# Patient Record
Sex: Female | Born: 1970 | Race: Black or African American | Hispanic: No | Marital: Married | Smoking: Never smoker
Health system: Southern US, Community
[De-identification: ages and names within clinical notes are randomized; demographics above are authoritative.]

## PROBLEM LIST (undated history)

## (undated) DIAGNOSIS — M797 Fibromyalgia: Secondary | ICD-10-CM

---

## 2004-01-27 ENCOUNTER — Ambulatory Visit (HOSPITAL_COMMUNITY): Admission: RE | Admit: 2004-01-27 | Discharge: 2004-01-27 | Payer: Self-pay | Admitting: *Deleted

## 2004-05-21 ENCOUNTER — Inpatient Hospital Stay (HOSPITAL_COMMUNITY): Admission: AD | Admit: 2004-05-21 | Discharge: 2004-05-21 | Payer: Self-pay | Admitting: *Deleted

## 2004-05-24 ENCOUNTER — Inpatient Hospital Stay (HOSPITAL_COMMUNITY): Admission: AD | Admit: 2004-05-24 | Discharge: 2004-05-24 | Payer: Self-pay | Admitting: Obstetrics and Gynecology

## 2004-05-31 ENCOUNTER — Inpatient Hospital Stay (HOSPITAL_COMMUNITY): Admission: AD | Admit: 2004-05-31 | Discharge: 2004-05-31 | Payer: Self-pay | Admitting: Obstetrics & Gynecology

## 2004-06-01 ENCOUNTER — Inpatient Hospital Stay (HOSPITAL_COMMUNITY): Admission: AD | Admit: 2004-06-01 | Discharge: 2004-06-03 | Payer: Self-pay | Admitting: Obstetrics and Gynecology

## 2006-02-10 ENCOUNTER — Ambulatory Visit (HOSPITAL_COMMUNITY): Admission: RE | Admit: 2006-02-10 | Discharge: 2006-02-10 | Payer: Self-pay | Admitting: Obstetrics and Gynecology

## 2011-06-22 ENCOUNTER — Encounter (HOSPITAL_COMMUNITY): Payer: Self-pay | Admitting: Emergency Medicine

## 2011-06-22 ENCOUNTER — Emergency Department (HOSPITAL_COMMUNITY)
Admission: EM | Admit: 2011-06-22 | Discharge: 2011-06-22 | Disposition: A | Discharge status: Home / Self Care | Payer: Medicaid Other | Attending: Emergency Medicine | Admitting: Emergency Medicine

## 2011-06-22 DIAGNOSIS — B9789 Other viral agents as the cause of diseases classified elsewhere: Secondary | ICD-10-CM

## 2011-06-22 DIAGNOSIS — J029 Acute pharyngitis, unspecified: Secondary | ICD-10-CM | POA: Insufficient documentation

## 2011-06-22 MED ORDER — DEXAMETHASONE 6 MG PO TABS
6.0000 mg | ORAL_TABLET | Freq: Once | ORAL | Status: AC
  Administered 2011-06-22: 6 mg via ORAL
  Filled 2011-06-22: qty 1

## 2011-06-22 MED ORDER — IBUPROFEN 200 MG PO TABS
600.0000 mg | ORAL_TABLET | Freq: Once | ORAL | Status: AC
  Administered 2011-06-22: 600 mg via ORAL
  Filled 2011-06-22: qty 3

## 2011-06-22 NOTE — Discharge Instructions (Signed)
Sore Throat Take ibuprofen and use saltwater gargles.  Follow up with your providers as dicussed in the ED today and as written above.  See your doctor immediately--or return to the ED--with any new or troubling symptoms including fevers, weakness, new chest pain, shortness or breath, numbness, or any other concerning symptom.  In the  Sore throats may be caused by bacteria and viruses. They may also be caused by:  Smoking.   Pollution.   Allergies.  If a sore throat is due to strep infection (a bacterial infection), you may need:  A throat swab.   A culture test to verify the strep infection.  You will need one of these:  An antibiotic shot.   Oral medicine for a full 10 days.  Strep infection is very contagious. A doctor should check any close contacts who have a sore throat or fever. A sore throat caused by a virus infection will usually last only 3-4 days. Antibiotics will not treat a viral sore throat.  Infectious mononucleosis (a viral disease), however, can cause a sore throat that lasts for up to 3 weeks. Mononucleosis can be diagnosed with blood tests. You must have been sick for at least 1 week in order for the test to give accurate results. HOME CARE INSTRUCTIONS   To treat a sore throat, take mild pain medicine.   Increase your fluids.   Eat a soft diet.   Do not smoke.   Gargling with warm water or salt water (1 tsp. salt in 8 oz. water) can be helpful.   Try throat sprays or lozenges or sucking on hard candy to ease the symptoms.  Call your doctor if your sore throat lasts longer than 1 week.  SEEK IMMEDIATE MEDICAL CARE IF:  You have difficulty breathing.   You have increased swelling in the throat.   You have pain so severe that you are unable to swallow fluids or your saliva.   You have a severe headache, a high fever, vomiting, or a red rash.  Document Released: 02/10/2004 Document Revised: 12/22/2010 Document Reviewed: 12/20/2006 [facility] Patient  Information 2012 [place], [place]. I and oriented and in [employer]  This solution will help make your mouth and throat feel better.    HOME CARE INSTRUCTIONS   Mix 1 teaspoon of salt in 8 ounces of warm water.   Gargle with this solution as much or often as you need or as directed. Swish and gargle gently if you have any sores or wounds in your mouth.   Do not swallow this mixture.  Document Released: 10/07/2003 Document Revised: 12/22/2010 Document Reviewed: 02/28/2008 [facility] Patient Information 2012 [employer] All of and equal in, [place]. In the and

## 2011-06-22 NOTE — ED Provider Notes (Signed)
History     CSN: [id number]  Arrival date & time 06/22/11  2142   First MD Initiated Contact with Patient 06/22/11 2207      Chief Complaint  Patient presents with  . Sore Throat    (Consider location/radiation/quality/duration/timing/severity/associated sxs/prior treatment) HPI  Patient is a 41 year old female with history of fibromyalgia presents today with a four-day history of sore throat and some congestion. Sore throat started first.  Sore throat is worse toward evening. She also does endorse some mild nasal congestion and drainage. She denies any fevers. She endorses a mild intermittent cough with no production. She denies any changes in urine output she denies any chest pain nausea, vomiting, or diarrhea. Denies headache. Denies any swelling of the neck or face. She calls her illness moderate. Her pain when swallowing is 6/10. On arrival temperature 98.54F, pulse 73, respirations 18, blood pressure is 120/89, SpO2 98% on room air    History reviewed. No pertinent past medical history.  History reviewed. No pertinent past surgical history.  No family history on file.  History  Substance Use Topics  . Smoking status: Never Smoker   . Smokeless tobacco: Not on file  . Alcohol Use: No    OB History    Grav Para Term Preterm Abortions TAB SAB Ect Mult Living                  Review of Systems Constitutional: Negative for fever and chills.  HENT: Negative for ear pain, POS sore throat and POS ondynophagia.  POS congestion.  Eyes: Negative for pain and visual disturbance.  Respiratory: Negative for cough and shortness of breath.   Cardiovascular: Negative for chest pain and leg swelling.  Gastrointestinal: Negative for nausea, vomiting, abdominal pain and diarrhea.  Genitourinary: Negative for dysuria, urgency and frequency.  Musculoskeletal: Negative for back pain and joint swelling.  Skin: Negative for rash and wound.  Neurological: Negative for dizziness, syncope,  speech difficulty, weakness and numbness.    Allergies  Latex  Home Medications   Current Outpatient Rx  Name Route Sig Dispense Refill  . IBUPROFEN 200 MG PO TABS Oral Take 400 mg by mouth every 6 (six) hours as needed. For pain    . OVER THE COUNTER MEDICATION Oral Take 2 tablets by mouth daily as needed. For congestion      BP 151/84  Pulse 65  Temp(Src) 98.5 F (36.9 C) (Oral)  Resp 18  SpO2 99%  LMP 06/21/2011  Physical Exam Consitutional: Pt in no acute distress.   Head: Normocephalic and atraumatic.  Eyes: Extraocular motion intact, no scleral icterus Neck: Supple without meningismus, mass, or overt JVD. No anterior LA.  OPA: mild erythema, cobblestones posterior pharynx.  No exudate or meaningful swelling of pillars.  Symmetric uvula.   Respiratory: Effort normal and breath sounds normal. No respiratory distress. CV: Heart regular rate and rhythm, no obvious murmurs.  Pulses +2 and symmetric Abdomen: Soft, non-tender, non-distended MSK: Extremities are atraumatic without deformity, ROM intact Skin: Warm, dry, intact Neuro: Alert and oriented, no motor deficit noted.  Psychiatric: Mood and affect are normal    ED Course  Procedures (including critical care time)   Labs Reviewed  RAPID STREP SCREEN   No results found.   1. Viral sore throat       MDM  Patient with a mild presentation and poor story for strep throat. However patient strongly desires sore throat swab. We'll complete rapid strep and throat culture. Patient looks very  well on exam with good saturations. Afebrile. Her exam is consistent with viral infection with some mild erythema of the posterior pharynx as well as boggy turbinates. Will treat here with Decadron and ibuprofen. Strep screen negative. Patient discharged home to follow up with primary care if not better in 3 days. PT DC home stable.  Discussed with pt the clinical impression, treatment in the ED, and follow up plan.  We alslo  discussed the indications for returning to the ED, which include shortness or breath, confusion, fever, new weakness or numbness, chest pain, or any other concerning symptom.  The pt understood the treatment and plan, is stable, and is able to leave the ED.     Jonetta Osgood MD 06/22/2011    11:52 PM       Larrie Kass, MD 06/22/11 2352

## 2011-06-22 NOTE — ED Notes (Signed)
MD at bedside. 

## 2011-06-22 NOTE — ED Notes (Signed)
PT. REPORTS SORE THROAT WITH OCCASIONAL PRODUCTIVE COUGH ONSET Tuesday THIS WEEK , DENIESFEVER OR CHILLS.

## 2011-06-24 NOTE — ED Provider Notes (Signed)
I saw and evaluated the patient, reviewed the resident's note and I agree with the findings and plan.   Zolton Dowson, MD 06/24/11 0711 

## 2011-10-19 ENCOUNTER — Emergency Department (HOSPITAL_COMMUNITY): Payer: No Typology Code available for payment source

## 2011-10-19 ENCOUNTER — Encounter (HOSPITAL_COMMUNITY): Payer: Self-pay | Admitting: *Deleted

## 2011-10-19 ENCOUNTER — Emergency Department (HOSPITAL_COMMUNITY)
Admission: EM | Admit: 2011-10-19 | Discharge: 2011-10-19 | Disposition: A | Discharge status: Home / Self Care | Payer: No Typology Code available for payment source | Attending: Emergency Medicine | Admitting: Emergency Medicine

## 2011-10-19 DIAGNOSIS — M542 Cervicalgia: Secondary | ICD-10-CM | POA: Insufficient documentation

## 2011-10-19 DIAGNOSIS — M2550 Pain in unspecified joint: Secondary | ICD-10-CM | POA: Insufficient documentation

## 2011-10-19 DIAGNOSIS — M25559 Pain in unspecified hip: Secondary | ICD-10-CM | POA: Insufficient documentation

## 2011-10-19 DIAGNOSIS — IMO0001 Reserved for inherently not codable concepts without codable children: Secondary | ICD-10-CM | POA: Insufficient documentation

## 2011-10-19 DIAGNOSIS — M545 Low back pain, unspecified: Secondary | ICD-10-CM | POA: Insufficient documentation

## 2011-10-19 DIAGNOSIS — M549 Dorsalgia, unspecified: Secondary | ICD-10-CM

## 2011-10-19 HISTORY — DX: Fibromyalgia: M79.7

## 2011-10-19 MED ORDER — METHOCARBAMOL 500 MG PO TABS: 500.0000 mg | ORAL_TABLET | Freq: Two times a day (BID) | ORAL | Status: DC

## 2011-10-19 MED ORDER — OXYCODONE-ACETAMINOPHEN 5-325 MG PO TABS
2.0000 | ORAL_TABLET | Freq: Once | ORAL | Status: AC
  Administered 2011-10-19: 2 via ORAL
  Filled 2011-10-19: qty 2

## 2011-10-19 MED ORDER — NAPROXEN 500 MG PO TABS: 500.0000 mg | ORAL_TABLET | Freq: Two times a day (BID) | ORAL | Status: DC

## 2011-10-19 MED ORDER — HYDROCODONE-ACETAMINOPHEN 5-325 MG PO TABS: 2.0000 | ORAL_TABLET | ORAL | Status: DC | PRN
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
## 2011-10-19 NOTE — ED Provider Notes (Signed)
Medical screening examination/treatment/procedure(s) were performed by non-physician practitioner and as supervising physician I was immediately available for consultation/collaboration.   Loren Racer, MD 10/19/11 [phone]

## 2011-10-19 NOTE — ED Notes (Signed)
Patient transported to X-ray 
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
## 2013-12-31 ENCOUNTER — Encounter (HOSPITAL_COMMUNITY): Payer: Self-pay | Admitting: Emergency Medicine

## 2013-12-31 ENCOUNTER — Emergency Department (HOSPITAL_COMMUNITY)
Admission: EM | Admit: 2013-12-31 | Discharge: 2013-12-31 | Disposition: A | Discharge status: Home / Self Care | Payer: Medicaid Other | Attending: Emergency Medicine | Admitting: Emergency Medicine

## 2013-12-31 ENCOUNTER — Emergency Department (HOSPITAL_COMMUNITY): Payer: Medicaid Other

## 2013-12-31 DIAGNOSIS — R1032 Left lower quadrant pain: Secondary | ICD-10-CM | POA: Insufficient documentation

## 2013-12-31 DIAGNOSIS — M549 Dorsalgia, unspecified: Secondary | ICD-10-CM | POA: Diagnosis not present

## 2013-12-31 DIAGNOSIS — Z3202 Encounter for pregnancy test, result negative: Secondary | ICD-10-CM | POA: Diagnosis not present

## 2013-12-31 DIAGNOSIS — Z91040 Latex allergy status: Secondary | ICD-10-CM | POA: Insufficient documentation

## 2013-12-31 LAB — CBC WITH DIFFERENTIAL/PLATELET
BASOS PCT: 1 % (ref 0–1)
Basophils Absolute: 0.1 10*3/uL (ref 0.0–0.1)
EOS ABS: 0.3 10*3/uL (ref 0.0–0.7)
Eosinophils Relative: 3 % (ref 0–5)
HCT: 38.6 % (ref 36.0–46.0)
Hemoglobin: 13.3 g/dL (ref 12.0–15.0)
Lymphocytes Relative: 32 % (ref 12–46)
Lymphs Abs: 2.9 10*3/uL (ref 0.7–4.0)
MCH: 33.4 pg (ref 26.0–34.0)
MCHC: 34.5 g/dL (ref 30.0–36.0)
MCV: 97 fL (ref 78.0–100.0)
Monocytes Absolute: 1 10*3/uL (ref 0.1–1.0)
Monocytes Relative: 11 % (ref 3–12)
NEUTROS PCT: 53 % (ref 43–77)
Neutro Abs: 4.8 10*3/uL (ref 1.7–7.7)
PLATELETS: 212 10*3/uL (ref 150–400)
RBC: 3.98 MIL/uL (ref 3.87–5.11)
RDW: 12.8 % (ref 11.5–15.5)
WBC: 9 10*3/uL (ref 4.0–10.5)

## 2013-12-31 LAB — URINALYSIS, ROUTINE W REFLEX MICROSCOPIC
Bilirubin Urine: NEGATIVE
Glucose, UA: NEGATIVE mg/dL
Ketones, ur: NEGATIVE mg/dL
Leukocytes, UA: NEGATIVE
NITRITE: NEGATIVE
PH: 5.5 (ref 5.0–8.0)
Protein, ur: NEGATIVE mg/dL
SPECIFIC GRAVITY, URINE: 1.021 (ref 1.005–1.030)
UROBILINOGEN UA: 0.2 mg/dL (ref 0.0–1.0)

## 2013-12-31 LAB — COMPREHENSIVE METABOLIC PANEL
ALBUMIN: 3.6 g/dL (ref 3.5–5.2)
ALK PHOS: 71 U/L (ref 39–117)
ALT: 14 U/L (ref 0–35)
AST: 14 U/L (ref 0–37)
Anion gap: 11 (ref 5–15)
BUN: 9 mg/dL (ref 6–23)
CO2: 22 mEq/L (ref 19–32)
Calcium: 9 mg/dL (ref 8.4–10.5)
Chloride: 104 mEq/L (ref 96–112)
Creatinine, Ser: 0.64 mg/dL (ref 0.50–1.10)
GFR calc Af Amer: >90 mL/min (ref >90)
GFR calc non Af Amer: >90 mL/min (ref >90)
Glucose, Bld: 87 mg/dL (ref 70–99)
POTASSIUM: 4.1 meq/L (ref 3.7–5.3)
SODIUM: 137 meq/L (ref 137–147)
TOTAL PROTEIN: 7 g/dL (ref 6.0–8.3)
Total Bilirubin: <0.2 mg/dL — ABNORMAL LOW (ref 0.3–1.2)

## 2013-12-31 LAB — POC URINE PREG, ED: Preg Test, Ur: NEGATIVE

## 2013-12-31 LAB — URINE MICROSCOPIC-ADD ON

## 2013-12-31 LAB — WET PREP, GENITAL
TRICH WET PREP: NONE SEEN
Yeast Wet Prep HPF POC: NONE SEEN

## 2013-12-31 LAB — LIPASE, BLOOD: Lipase: 39 U/L (ref 11–59)

## 2013-12-31 MED ORDER — ONDANSETRON HCL 4 MG/2ML IJ SOLN
4.0000 mg | Freq: Once | INTRAMUSCULAR | Status: AC
  Administered 2013-12-31: 4 mg via INTRAVENOUS
  Filled 2013-12-31: qty 2

## 2013-12-31 MED ORDER — MORPHINE SULFATE 4 MG/ML IJ SOLN
4.0000 mg | Freq: Once | INTRAMUSCULAR | Status: AC
  Administered 2013-12-31: 4 mg via INTRAVENOUS
  Filled 2013-12-31: qty 1

## 2013-12-31 MED ORDER — HYDROCODONE-ACETAMINOPHEN 5-325 MG PO TABS: 1.0000 | ORAL_TABLET | Freq: Four times a day (QID) | ORAL | Status: DC | PRN

## 2013-12-31 MED ORDER — IOHEXOL 300 MG/ML  SOLN: 100.0000 mL | Freq: Once | INTRAMUSCULAR | Status: DC | PRN

## 2013-12-31 MED ORDER — ONDANSETRON HCL 4 MG PO TABS: 4.0000 mg | ORAL_TABLET | Freq: Four times a day (QID) | ORAL | Status: DC

## 2013-12-31 NOTE — ED Notes (Signed)
Pt c/o lower abd pain and back pain x 3 days 

## 2013-12-31 NOTE — ED Notes (Signed)
Pt reports lower abdominal pain radiating to the back bilaterally since Monday.  Pt has had diarrhea and nausea with no emesis.  Pt denies burning on urination.

## 2013-12-31 NOTE — ED Notes (Signed)
Pt made aware to return if symptoms worsen or if any life threatening symptoms occur.   

## 2013-12-31 NOTE — ED Notes (Signed)
Pt returned from CT °

## 2013-12-31 NOTE — ED Provider Notes (Signed)
CSN: [id number]     Arrival date & time 12/31/13  1437 History   First MD Initiated Contact with Patient 12/31/13 1741     Chief Complaint  Patient presents with  . Abdominal Pain  . Back Pain     (Consider location/radiation/quality/duration/timing/severity/associated sxs/prior Treatment) HPI Comments: Patient presents today with a chief complaint of abdominal pain.  She reports that the pain is located across her lower abdomen, but worse in the LLQ.  Pain has been intermittent for the past 3 days.  She describes the pain as cramping.  She reports that it radiates to her lower back.  It is worse with standing and also palpation.  She has not taken anything for the pain.  Pain associated with nausea, but no vomiting.  No fever, chills, constipation, urinary symptoms, SOB, or chest pain.  She does reports some associated diarrhea over the past 2 days.  She has had 5-6 episodes daily.  No blood in her stool.  She started her menstrual cycle today, but states that this pain is not her typical menstrual cramps.  No prior history of abdominal surgeries.  No history of Diverticulitis.  Patient is a 43 y.o. female presenting with abdominal pain and back pain. The history is provided by the patient.  Abdominal Pain Back Pain Associated symptoms: abdominal pain     Past Medical History  Diagnosis Date  . Fibromyalgia    History reviewed. No pertinent past surgical history. History reviewed. No pertinent family history. History  Substance Use Topics  . Smoking status: Never Smoker   . Smokeless tobacco: Never Used  . Alcohol Use: No   OB History    No data available     Review of Systems  Gastrointestinal: Positive for abdominal pain.  Musculoskeletal: Positive for back pain.  All other systems reviewed and are negative.     Allergies  Latex  Home Medications   Prior to Admission medications   Medication Sig Start Date End Date Taking? Authorizing Provider  ibuprofen  (ADVIL,MOTRIN) 200 MG tablet Take 400 mg by mouth every 6 (six) hours as needed. For pain   Yes Historical Provider, MD  HYDROcodone-acetaminophen (NORCO/VICODIN) 5-325 MG per tablet Take 2 tablets by mouth every 4 (four) hours as needed for pain. Patient not taking: Reported on 12/31/2013 10/19/11   Dahlia ClientHannah Muthersbaugh, PA-C  methocarbamol (ROBAXIN) 500 MG tablet Take 1 tablet (500 mg total) by mouth 2 (two) times daily. Patient not taking: Reported on 12/31/2013 10/19/11   Dahlia ClientHannah Muthersbaugh, PA-C  naproxen (NAPROSYN) 500 MG tablet Take 1 tablet (500 mg total) by mouth 2 (two) times daily with a meal. Patient not taking: Reported on 12/31/2013 10/19/11   Dahlia ClientHannah Muthersbaugh, PA-C   BP 146/84 mmHg  Pulse 63  Temp(Src) 98.5 F (36.9 C) (Oral)  Resp 20  Ht 5' 4.5" (1.638 m)  Wt 175 lb (79.379 kg)  BMI 29.59 kg/m2  SpO2 99%  LMP 12/31/2013 Physical Exam  Constitutional: She appears well-developed and well-nourished.  HENT:  Head: Normocephalic and atraumatic.  Mouth/Throat: Oropharynx is clear and moist.  Neck: Normal range of motion. Neck supple.  Cardiovascular: Normal rate, regular rhythm and normal heart sounds.   Pulmonary/Chest: Effort normal and breath sounds normal.  Abdominal: Soft. Bowel sounds are normal. She exhibits no distension and no mass. There is tenderness in the left lower quadrant. There is rebound. There is no guarding.  Genitourinary: Cervix exhibits no motion tenderness and no discharge. Right adnexum displays no mass, no  tenderness and no fullness. Left adnexum displays no mass, no tenderness and no fullness.  Musculoskeletal: Normal range of motion.  Neurological: She is alert.  Skin: Skin is warm and dry.  Nursing note and vitals reviewed.   ED Course  Procedures (including critical care time) Labs Review Labs Reviewed  WET PREP, GENITAL - Abnormal; Notable for the following:    Clue Cells Wet Prep HPF POC RARE (*)    WBC, Wet Prep HPF POC MODERATE (*)     All other components within normal limits  URINALYSIS, ROUTINE W REFLEX MICROSCOPIC - Abnormal; Notable for the following:    Hgb urine dipstick TRACE (*)    All other components within normal limits  COMPREHENSIVE METABOLIC PANEL - Abnormal; Notable for the following:    Total Bilirubin <0.2 (*)    All other components within normal limits  GC/CHLAMYDIA PROBE AMP  URINE MICROSCOPIC-ADD ON  CBC WITH DIFFERENTIAL  LIPASE, BLOOD  POC URINE PREG, ED    Imaging Review Ct Abdomen Pelvis W Contrast  12/31/2013   CLINICAL DATA:  Left lower quadrant abdominal pain.  EXAM: CT ABDOMEN AND PELVIS WITH CONTRAST  TECHNIQUE: Multidetector CT imaging of the abdomen and pelvis was performed using the standard protocol following bolus administration of intravenous contrast.  CONTRAST:  100 mL of Omnipaque 300 intravenously.  COMPARISON:  None.  FINDINGS: Visualized lung bases appear normal. No significant osseous abnormality is noted.  No gallstones are noted. The liver, spleen and pancreas appear normal. Adrenal glands and kidneys appear normal. The appendix appears normal. No hydronephrosis or renal obstruction is noted. Mild urinary bladder distention is noted. No abnormal fluid collection is noted. Uterus and ovaries appear normal.  There is noted severe wall thickening involving portions of small bowel in the central abdomen, resulting in dilatation of the more proximal small bowel. This may represent focal enteritis, but infiltrative disease such as lymphoma cannot be excluded. The more distal small bowel appears to be normal in caliber and wall thickness.  IMPRESSION: Severe wall thickening is seen involving several loops of the small bowel in the central portion of the abdomen, resulting in dilatation of the more proximal small bowel. This is concerning for severe focal inflammation or enteritis, or possibly infiltrative disease such as lymphoma.   Electronically Signed   By: Roque LiasJames  Green M.D.   On:  12/31/2013 21:05     EKG Interpretation None     9:15 PM Reassessed patient.  She reports that pain and nausea have improved at this time.  Abdomen soft with mild tenderness of the LLQ.  Discussed CT results with the patient. MDM   Final diagnoses:  LLQ abdominal pain   Patient presents today with abdominal pain and diarrhea.  Labs today unremarkable.  She initially did have some rebound tenderness and pain was worse of the LLQ.  Therefore, CT ab/pelvis was ordered.  Results as above showing changes most consistent with Enteritis.  Pain and nausea improved in the ED.  VSS.  Feel that the patient is stable for discharge.  She was given referral to GI.  Return precautions given.    Santiago GladHeather Letitia Sabala, PA-C 01/02/14 [id number]  Tilden FossaElizabeth Rees, MD 01/05/14 [phone]

## 2013-12-31 NOTE — ED Notes (Signed)
Notified CT patient finished drinking contrast stated currently sending for patient.

## 2014-01-01 LAB — GC/CHLAMYDIA PROBE AMP
CT PROBE, AMP APTIMA: NEGATIVE
GC PROBE AMP APTIMA: NEGATIVE

## 2014-01-01 NOTE — ED Provider Notes (Signed)
Medical screening examination/treatment/procedure(s) were conducted as a shared visit with non-physician practitioner(s) and myself.  I personally evaluated the patient during the encounter.   EKG Interpretation None      Pt here with AP, diarrhea.  On exam pt has mild tenderness, no guarding/rebound.  CT scan with enteritis.  D/w pt PCP followup as well as importance of recheck for resolution of clearing - by PCP or GI.  Return precautions discussed.    Tilden FossaElizabeth Joycie Aerts, MD 01/01/14 0005

## 2014-06-30 ENCOUNTER — Encounter (HOSPITAL_COMMUNITY): Payer: Self-pay

## 2014-06-30 ENCOUNTER — Emergency Department (HOSPITAL_COMMUNITY)
Admission: EM | Admit: 2014-06-30 | Discharge: 2014-06-30 | Disposition: A | Discharge status: Home / Self Care | Payer: Medicaid Other | Attending: Emergency Medicine | Admitting: Emergency Medicine

## 2014-06-30 DIAGNOSIS — Z91040 Latex allergy status: Secondary | ICD-10-CM | POA: Insufficient documentation

## 2014-06-30 DIAGNOSIS — J029 Acute pharyngitis, unspecified: Secondary | ICD-10-CM | POA: Insufficient documentation

## 2014-06-30 DIAGNOSIS — H9203 Otalgia, bilateral: Secondary | ICD-10-CM | POA: Insufficient documentation

## 2014-06-30 DIAGNOSIS — J0100 Acute maxillary sinusitis, unspecified: Secondary | ICD-10-CM | POA: Diagnosis not present

## 2014-06-30 DIAGNOSIS — Z79899 Other long term (current) drug therapy: Secondary | ICD-10-CM | POA: Insufficient documentation

## 2014-06-30 DIAGNOSIS — R05 Cough: Secondary | ICD-10-CM | POA: Diagnosis present

## 2014-06-30 DIAGNOSIS — Z8739 Personal history of other diseases of the musculoskeletal system and connective tissue: Secondary | ICD-10-CM | POA: Diagnosis not present

## 2014-06-30 LAB — RAPID STREP SCREEN (MED CTR MEBANE ONLY): STREPTOCOCCUS, GROUP A SCREEN (DIRECT): NEGATIVE

## 2014-06-30 MED ORDER — MAGIC MOUTHWASH
5.0000 mL | Freq: Once | ORAL | Status: AC
  Administered 2014-06-30: 5 mL via ORAL
  Filled 2014-06-30 (×2): qty 5

## 2014-06-30 MED ORDER — AMOXICILLIN 500 MG PO CAPS: 500.0000 mg | ORAL_CAPSULE | Freq: Three times a day (TID) | ORAL | Status: DC

## 2014-06-30 MED ORDER — AMOXICILLIN 500 MG PO CAPS
500.0000 mg | ORAL_CAPSULE | Freq: Once | ORAL | Status: AC
  Administered 2014-06-30: 500 mg via ORAL
  Filled 2014-06-30: qty 1

## 2014-06-30 NOTE — Discharge Instructions (Signed)
Take amoxicillin as prescribed until all gone. Ibuprofen or Tylenol for pain. Continue saltwater gargles. Get Cepacol throat losengers for pain. Follow-up with primary care doctor.  Pharyngitis Pharyngitis is redness, pain, and swelling (inflammation) of your pharynx.  CAUSES  Pharyngitis is usually caused by infection. Most of the time, these infections are from viruses (viral) and are part of a cold. However, sometimes pharyngitis is caused by bacteria (bacterial). Pharyngitis can also be caused by allergies. Viral pharyngitis may be spread from person to person by coughing, sneezing, and personal items or utensils (cups, forks, spoons, toothbrushes). Bacterial pharyngitis may be spread from person to person by more intimate contact, such as kissing.  SIGNS AND SYMPTOMS  Symptoms of pharyngitis include:   Sore throat.   Tiredness (fatigue).   Low-grade fever.   Headache.  Joint pain and muscle aches.  Skin rashes.  Swollen lymph nodes.  Plaque-like film on throat or tonsils (often seen with bacterial pharyngitis). DIAGNOSIS  Your health care provider will ask you questions about your illness and your symptoms. Your medical history, along with a physical exam, is often all that is needed to diagnose pharyngitis. Sometimes, a rapid strep test is done. Other lab tests may also be done, depending on the suspected cause.  TREATMENT  Viral pharyngitis will usually get better in 3-4 days without the use of medicine. Bacterial pharyngitis is treated with medicines that kill germs (antibiotics).  HOME CARE INSTRUCTIONS   Drink enough water and fluids to keep your urine clear or pale yellow.   Only take over-the-counter or prescription medicines as directed by your health care provider:   If you are prescribed antibiotics, make sure you finish them even if you start to feel better.   Do not take aspirin.   Get lots of rest.   Gargle with 8 oz of salt water ( tsp of salt per  1 qt of water) as often as every 1-2 hours to soothe your throat.   Throat lozenges (if you are not at risk for choking) or sprays may be used to soothe your throat. SEEK MEDICAL CARE IF:   You have large, tender lumps in your neck.  You have a rash.  You cough up green, yellow-brown, or bloody spit. SEEK IMMEDIATE MEDICAL CARE IF:   Your neck becomes stiff.  You drool or are unable to swallow liquids.  You vomit or are unable to keep medicines or liquids down.  You have severe pain that does not go away with the use of recommended medicines.  You have trouble breathing (not caused by a stuffy nose). MAKE SURE YOU:   Understand these instructions.  Will watch your condition.  Will get help right away if you are not doing well or get worse. Document Released: 01/02/2005 Document Revised: 10/23/2012 Document Reviewed: 09/09/2012 [facility] Patient Information 2015 [place], [place]. This information is not intended to replace advice given to you by your health care provider. Make sure you discuss any questions you have with your health care provider.

## 2014-06-30 NOTE — ED Notes (Signed)
Onset 06-25-14 sore throat, runny nose- green-yellow drainage, nasal congestion, fever, cough.  No respiratory or swallowing difficulties.

## 2014-06-30 NOTE — ED Provider Notes (Signed)
CSN: [id number]     Arrival date & time 06/30/14  1558 History  This chart was scribed for non-physician practitioner Jaynie Crumble, PA-C working with Arby Barrette, MD by Lyndel Safe, ED Scribe. This patient was seen in room TR03C/TR03C and the patient's care was started at [facility] PM.   Chief Complaint  Patient presents with  . URI   The history is provided by the patient. No language interpreter was used.    HPI Comments: Ann Dougherty is a 44 y.o. female, with a PMhx of fibromyalgia, who presents to the Emergency Department complaining of a progressively worsening, constant, moderate sore throat that began 6 days ago. Pt reports associated otalgia bilaterally, mild cough, nasal congestion, green/yellow nasal drainage, and a post nasal drip. She reports her sore throat is worse at night. Pt also notes that she is having pain when swallowing and has not been able to eat much the past 3 days. Pt has taken Alkaseltzer, salt water rinses, garlic, and OTC sinus medication with no relief. Her husband was ill with a sore throat 2 weeks ago but his symptoms have since resolved. She has also had a sick contact at work with similar sore throat symptoms. She denies any CP, SOB, dysphagia, fever, chills, vomiting, headache, constipation, diarrhea, or eye pain.   Past Medical History  Diagnosis Date  . Fibromyalgia    History reviewed. No pertinent past surgical history. History reviewed. No pertinent family history. History  Substance Use Topics  . Smoking status: Never Smoker   . Smokeless tobacco: Never Used  . Alcohol Use: Yes     Comment: rarely    OB History    No data available     Review of Systems  Constitutional: Negative for fever and chills.  HENT: Positive for congestion, ear pain, postnasal drip, sore throat and trouble swallowing.   Eyes: Negative for pain and discharge.  Respiratory: Positive for cough. Negative for shortness of breath.   Cardiovascular: Negative for  chest pain.  Gastrointestinal: Negative for nausea, vomiting, diarrhea and constipation.  Neurological: Negative for headaches.    Allergies  Latex  Home Medications   Prior to Admission medications   Medication Sig Start Date End Date Taking? Authorizing Provider  DM-Phenylephrine-Acetaminophen (ALKA-SELTZER PLS SINUS & COUGH) 10-5-325 MG CAPS Take 1 capsule by mouth daily as needed (cough, congestion).   Yes Historical Provider, MD  GARLIC PO Take 1 tablet by mouth daily.   Yes Historical Provider, MD  HYDROcodone-acetaminophen (NORCO/VICODIN) 5-325 MG per tablet Take 1-2 tablets by mouth every 6 (six) hours as needed. Patient not taking: Reported on 06/30/2014 12/31/13   Santiago Glad, PA-C  methocarbamol (ROBAXIN) 500 MG tablet Take 1 tablet (500 mg total) by mouth 2 (two) times daily. Patient not taking: Reported on 12/31/2013 10/19/11   Dahlia Client Muthersbaugh, PA-C  naproxen (NAPROSYN) 500 MG tablet Take 1 tablet (500 mg total) by mouth 2 (two) times daily with a meal. Patient not taking: Reported on 12/31/2013 10/19/11   Dahlia Client Muthersbaugh, PA-C  ondansetron (ZOFRAN) 4 MG tablet Take 1 tablet (4 mg total) by mouth every 6 (six) hours. Patient not taking: Reported on 06/30/2014 12/31/13   Heather Laisure, PA-C   BP 156/103 mmHg  Pulse 103  Temp(Src) 99.5 F (37.5 C) (Oral)  Resp 18  SpO2 98%  LMP 06/09/2014 Physical Exam  Constitutional: She is oriented to person, place, and time. She appears well-developed and well-nourished. No distress.  HENT:  Head: Normocephalic.  Right Ear: Tympanic membrane,  external ear and ear canal normal.  Left Ear: Tympanic membrane, external ear and ear canal normal.  Nose: Right sinus exhibits maxillary sinus tenderness and frontal sinus tenderness. Left sinus exhibits maxillary sinus tenderness and frontal sinus tenderness.  Mouth/Throat: Uvula is midline and mucous membranes are normal. Posterior oropharyngeal erythema present. No oropharyngeal  exudate, posterior oropharyngeal edema or tonsillar abscesses.  Eyes: Pupils are equal, round, and reactive to light. Right eye exhibits no discharge. Left eye exhibits no discharge. No scleral icterus.  Neck: No JVD present.  Cardiovascular: Normal rate, regular rhythm and normal heart sounds.   Pulmonary/Chest: Effort normal and breath sounds normal. No respiratory distress.  Musculoskeletal: She exhibits no edema.  Lymphadenopathy:    She has no cervical adenopathy.  Neurological: She is alert and oriented to person, place, and time.  Skin: Skin is warm. No rash noted. No erythema. No pallor.  Psychiatric: She has a normal mood and affect. Her behavior is normal.  Nursing note and vitals reviewed.  ED Course  Procedures  DIAGNOSTIC STUDIES: Oxygen Saturation is 98% on RA, normal by my interpretation.    COORDINATION OF CARE: 6:15 PM Discussed treatment plan which includes to order a rapid strep test and alleviating medicatoin. Pt acknowledges and agrees to plan.   Labs Review Labs Reviewed  RAPID STREP SCREEN (NOT AT [facility])  CULTURE, GROUP A STREP    Imaging Review No results found.   EKG Interpretation None      MDM   Final diagnoses:  Acute maxillary sinusitis, recurrence not specified  Pharyngitis   Patient with green thick nasal discharge, sinus pain and tenderness, sore throat. Overall, nontoxic appearing. Strep screen is negative. No cough. Concern about sinusitis, we'll treat with amoxicillin, otherwise symptomatic treatment. Next point this could be still viral, and it will get better with time. Patient voiced understanding. No difficulty swallowing. No evidence of peritonsillar abscess. No other complaints.  Filed Vitals:   06/30/14 1624 06/30/14 1757 06/30/14 1937 06/30/14 1941  BP: 156/103 146/89 137/115   Pulse: 103 85 84   Temp: 99.5 F (37.5 C) 98.9 F (37.2 C)  98.7 F (37.1 C)  TempSrc: Oral Oral Oral Oral  Resp: SpO2: 98% 100% 100%        I personally performed the services described in this documentation, which was scribed in my presence. The recorded information has been reviewed and is accurate.   Jaynie Crumble, PA-C 06/30/14 2258  Arby Barrette, MD 07/03/14 [phone]

## 2014-06-30 NOTE — ED Notes (Signed)
Waiting for magic mouthwash to come from pharmacy to discharge.

## 2014-07-04 LAB — CULTURE, GROUP A STREP

## 2017-08-23 ENCOUNTER — Ambulatory Visit (HOSPITAL_COMMUNITY)
Admission: EM | Admit: 2017-08-23 | Discharge: 2017-08-23 | Disposition: A | Discharge status: Home / Self Care | Payer: Medicaid Other | Attending: Family Medicine | Admitting: Family Medicine

## 2017-08-23 ENCOUNTER — Encounter (HOSPITAL_COMMUNITY): Payer: Self-pay

## 2017-08-23 DIAGNOSIS — R109 Unspecified abdominal pain: Secondary | ICD-10-CM | POA: Diagnosis not present

## 2017-08-23 LAB — POCT URINALYSIS DIP (DEVICE)
BILIRUBIN URINE: NEGATIVE
Glucose, UA: NEGATIVE mg/dL
Ketones, ur: NEGATIVE mg/dL
LEUKOCYTES UA: NEGATIVE
NITRITE: NEGATIVE
Protein, ur: NEGATIVE mg/dL
Specific Gravity, Urine: 1.025 (ref 1.005–1.030)
Urobilinogen, UA: 0.2 mg/dL (ref 0.0–1.0)
pH: 5.5 (ref 5.0–8.0)

## 2017-08-23 LAB — POCT PREGNANCY, URINE: PREG TEST UR: NEGATIVE

## 2017-08-23 NOTE — Discharge Instructions (Addendum)
You have been seen today for abdominal discomfort. Your evaluation was not suggestive of any emergent condition requiring medical intervention at this time. However, some abdominal problems make take more time to appear. Therefore, it is important for you to watch for any new symptoms or worsening of your current condition. Please return here or to the Emergency Department immediately should you feel worse in any way or have any of the following symptoms: increasing or different abdominal pain, persistent vomiting, fevers, or shaking chills.

## 2017-08-23 NOTE — ED Triage Notes (Signed)
Pt presents with generalized but mostly lower pelvic pain

## 2017-08-29 NOTE — ED Provider Notes (Signed)
[facility]   [id number] 08/23/17 Arrival Time: 1621  ASSESSMENT & PLAN:  1. Abdominal discomfort    Discussed possible causes of pelvic pain.   Discharge Instructions     You have been seen today for abdominal discomfort. Your evaluation was not suggestive of any emergent condition requiring medical intervention at this time. However, some abdominal problems make take more time to appear. Therefore, it is important for you to watch for any new symptoms or worsening of your current condition. Please return here or to the Emergency Department immediately should you feel worse in any way or have any of the following symptoms: increasing or different abdominal pain, persistent vomiting, fevers, or shaking chills.     Agrees to proceed to the ED for evaluation/imaging if this does not continue to improve. As this is improving, she elects to monitor symptoms. Declines STI testing.  Reviewed expectations re: course of current medical issues. Questions answered. Outlined signs and symptoms indicating need for more acute intervention. Patient verbalized understanding. After Visit Summary given.   SUBJECTIVE:  Ann Dougherty is a 47 y.o. female who presents with complaint of intermittent and transient abdominal discomfort. Onset gradual, for several days. Location: mostly lower abdomen/pelvis without radiation. Usually lasts a few seconds and resolves. Described as soreness. Symptoms are gradually improving since beginning. Aggravating factors: none. Alleviating factors: none. Associated symptoms: none. She denies arthralgias, belching, chills, constipation, diarrhea, dysuria, fever, nausea, sweats and vomiting. Appetite: normal. PO intake: normal. Ambulatory without assistance. Urinary symptoms: none. Last bowel movement yesterday without blood. OTC treatment: none.  Patient's last menstrual period was 05/23/2017 (lmp unknown).   History reviewed. No pertinent surgical  history.  ROS: As per HPI. All other systems negative.  OBJECTIVE:  Vitals:   08/23/17 1702 08/23/17 1707  BP: 129/86 134/88  Pulse: 82 80  Resp: 18 20  Temp: 98.4 F (36.9 C) 98.3 F (36.8 C)  TempSrc: Oral Oral  SpO2: 97% 99%    General appearance: alert; no distress Lungs: clear to auscultation bilaterally Heart: regular rate and rhythm Abdomen: soft; non-distended; no tenderness; bowel sounds present; no masses or organomegaly; no guarding or rebound tenderness GU: declines Back: no CVA tenderness; FROM at hips Extremities: no edema; symmetrical with no gross deformities Skin: warm and dry Neurologic: normal gait Psychological: alert and cooperative; normal mood and affect  Labs: Results for orders placed or performed during the hospital encounter of 08/23/17  POCT urinalysis dip (device)  Result Value Ref Range   Glucose, UA NEGATIVE NEGATIVE mg/dL   Bilirubin Urine NEGATIVE NEGATIVE   Ketones, ur NEGATIVE NEGATIVE mg/dL   Specific Gravity, Urine 1.025 1.005 - 1.030   Hgb urine dipstick TRACE (A) NEGATIVE   pH 5.5 5.0 - 8.0   Protein, ur NEGATIVE NEGATIVE mg/dL   Urobilinogen, UA 0.2 0.0 - 1.0 mg/dL   Nitrite NEGATIVE NEGATIVE   Leukocytes, UA NEGATIVE NEGATIVE  Pregnancy, urine POC  Result Value Ref Range   Preg Test, Ur NEGATIVE NEGATIVE   Labs Reviewed  POCT URINALYSIS DIP (DEVICE) - Abnormal; Notable for the following components:      Result Value   Hgb urine dipstick TRACE (*)    All other components within normal limits  POCT PREGNANCY, URINE   Allergies  Allergen Reactions  . Latex Itching and Swelling  Past Medical History:  Diagnosis Date  . Fibromyalgia    Social History   Socioeconomic History  . Marital status: Married    Spouse name: Not on file  . Number of children: Not on file  . Years of education: Not on file  . Highest education level: Not on file  Occupational History  . Not on  file  Social Needs  . Financial resource strain: Not on file  . Food insecurity:    Worry: Not on file    Inability: Not on file  . Transportation needs:    Medical: Not on file    Non-medical: Not on file  Tobacco Use  . Smoking status: Never Smoker  . Smokeless tobacco: Never Used  Substance and Sexual Activity  . Alcohol use: Yes    Comment: rarely   . 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 Sexual activity: Not on file  Lifestyle  . Physical activity:    Days per week: Not on file    Minutes per session: Not on file  . Stress: Not on file  Relationships  . Social connections:    Talks on phone: Not on file    Gets together: Not on file    Attends religious service: Not on file    Active member of club or organization: Not on file    Attends meetings of clubs or organizations: Not on file    Relationship status: Not on file  . Intimate partner violence:    Fear of current or ex partner: Not on file    Emotionally abused: Not on file    Physically abused: Not on file    Forced sexual activity: Not on file  Other Topics Concern  . Not on file  Social History Narrative  . Not on file   FH: no chronic GI problems   Mardella LaymanHagler, Valen Mascaro, MD 08/29/17 1027

## 2018-06-21 ENCOUNTER — Encounter (HOSPITAL_COMMUNITY): Payer: Self-pay

## 2018-06-21 ENCOUNTER — Emergency Department (HOSPITAL_COMMUNITY)
Admission: EM | Admit: 2018-06-21 | Discharge: 2018-06-21 | Disposition: A | Discharge status: Home / Self Care | Payer: Medicaid Other | Attending: Emergency Medicine | Admitting: Emergency Medicine

## 2018-06-21 ENCOUNTER — Other Ambulatory Visit: Payer: Self-pay

## 2018-06-21 ENCOUNTER — Emergency Department (HOSPITAL_COMMUNITY): Payer: Medicaid Other

## 2018-06-21 DIAGNOSIS — R102 Pelvic and perineal pain: Secondary | ICD-10-CM | POA: Insufficient documentation

## 2018-06-21 DIAGNOSIS — Z79899 Other long term (current) drug therapy: Secondary | ICD-10-CM | POA: Insufficient documentation

## 2018-06-21 DIAGNOSIS — N926 Irregular menstruation, unspecified: Secondary | ICD-10-CM | POA: Insufficient documentation

## 2018-06-21 DIAGNOSIS — Z91040 Latex allergy status: Secondary | ICD-10-CM | POA: Diagnosis not present

## 2018-06-21 LAB — URINALYSIS, ROUTINE W REFLEX MICROSCOPIC
Bilirubin Urine: NEGATIVE
Glucose, UA: NEGATIVE mg/dL
Hgb urine dipstick: NEGATIVE
Ketones, ur: NEGATIVE mg/dL
Leukocytes,Ua: NEGATIVE
Nitrite: NEGATIVE
Protein, ur: NEGATIVE mg/dL
Specific Gravity, Urine: 1.003 — ABNORMAL LOW (ref 1.005–1.030)
pH: 6 (ref 5.0–8.0)

## 2018-06-21 LAB — WET PREP, GENITAL
Sperm: NONE SEEN
Trich, Wet Prep: NONE SEEN
Yeast Wet Prep HPF POC: NONE SEEN

## 2018-06-21 LAB — POC URINE PREG, ED: Preg Test, Ur: NEGATIVE

## 2018-06-21 MED ORDER — ACETAMINOPHEN 500 MG PO TABS
1000.0000 mg | ORAL_TABLET | Freq: Once | ORAL | Status: AC
  Administered 2018-06-21: 1000 mg via ORAL
  Filled 2018-06-21: qty 2

## 2018-06-21 MED ORDER — METRONIDAZOLE 500 MG PO TABS: 500.0000 mg | ORAL_TABLET | Freq: Two times a day (BID) | ORAL | 0 refills | Status: DC

## 2018-06-21 NOTE — Discharge Instructions (Addendum)
It was our pleasure to provide your ER care today - we hope that you feel better.  On your pelvic exam there was a mild discharge - take antibiotic as prescribed.   Take acetaminophen or ibuprofen as need.  Follow up with ob/gyn doctor in the next 1-2 weeks - call office Monday AM to arrange appointment.  Also follow up with primary care doctor in the next 1-2 weeks for recheck, recheck of blood pressure, as it is elevated today.   Return to ER right away if worse, new symptoms, fevers, worsening or severe pain, persistent vomiting, other concern.

## 2018-06-21 NOTE — ED Provider Notes (Signed)
[facility] EMERGENCY DEPARTMENT Provider Note   CSN: [id number] Arrival date & time: 06/21/18  1318    History   Chief Complaint Chief Complaint  Patient presents with   Pelvic Pain    HPI Ann Dougherty is a 48 y.o. female.     Patient c/o pelvic pain for the past couple days. Symptoms acute onset, episodic, recurrent, dull/cramping, moderate, occasionally radiating towards back/hip ('like a menstrual pain'). Symptoms lasts seconds to minutes. No radicular/leg pain. No saddle or leg numbness or weakness. No dysuria or gu c/o. No vaginal discharge. No vaginal bleeding x 3 months - states normally periods are regular. Normal appetite. No nv. No fever or chills. No abd distension. Denies cough or uri symptoms. Remote hx ovarian cyst. No hx endometriosis or fibroids. No recent change in meds or new meds.   The history is provided by the patient.  Pelvic Pain  Pertinent negatives include no chest pain, no abdominal pain, no headaches and no shortness of breath.    Past Medical History:  Diagnosis Date   Fibromyalgia     There are no active problems to display for this patient.   History reviewed. No pertinent surgical history.   OB History   No obstetric history on file.      Home Medications    Prior to Admission medications   Medication Sig Start Date End Date Taking? Authorizing Provider  amoxicillin (AMOXIL) 500 MG capsule Take 1 capsule (500 mg total) by mouth 3 (three) times daily. 06/30/14   Kirichenko, Lemont Fillers, PA-C  DM-Phenylephrine-Acetaminophen (ALKA-SELTZER PLS SINUS & COUGH) 10-5-325 MG CAPS Take 1 capsule by mouth daily as needed (cough, congestion).    [provider]  GARLIC PO Take 1 tablet by mouth daily.    [provider]  HYDROcodone-acetaminophen (NORCO/VICODIN) 5-325 MG per tablet Take 1-2 tablets by mouth every 6 (six) hours as needed. Patient not taking: Reported on 06/30/2014 12/31/13   Santiago Glad,  PA-C  methocarbamol (ROBAXIN) 500 MG tablet Take 1 tablet (500 mg total) by mouth 2 (two) times daily. Patient not taking: Reported on 12/31/2013 10/19/11   Muthersbaugh, Dahlia Client, PA-C  naproxen (NAPROSYN) 500 MG tablet Take 1 tablet (500 mg total) by mouth 2 (two) times daily with a meal. Patient not taking: Reported on 12/31/2013 10/19/11   Muthersbaugh, Dahlia Client, PA-C  ondansetron (ZOFRAN) 4 MG tablet Take 1 tablet (4 mg total) by mouth every 6 (six) hours. Patient not taking: Reported on 06/30/2014 12/31/13   Santiago Glad, PA-C    Family History No family history on file.  Social History Social History   Tobacco Use   Smoking status: Never Smoker   Smokeless tobacco: Never Used  Substance Use Topics   Alcohol use: Yes    Comment: rarely    Drug use: Yes    Types: Marijuana    Comment: Daily use for Fibromyalgia pain     Allergies   Latex   Review of Systems Review of Systems  Constitutional: Negative for chills and fever.  HENT: Negative for sore throat.   Eyes: Negative for redness.  Respiratory: Negative for cough and shortness of breath.   Cardiovascular: Negative for chest pain.  Gastrointestinal: Negative for abdominal pain, diarrhea and vomiting.  Endocrine: Negative for polyuria.  Genitourinary: Positive for pelvic pain. Negative for dysuria, flank pain, hematuria, vaginal bleeding and vaginal discharge.  Musculoskeletal: Negative for neck pain.  Skin: Negative for rash.  Neurological: Negative for weakness, numbness and headaches.  Hematological:  Does not bruise/bleed easily.  Psychiatric/Behavioral: Negative for confusion.     Physical Exam Updated Vital Signs BP 135/87    Pulse 69    Temp 98.5 F (36.9 C) (Oral)    Resp 18    SpO2 98%   Physical Exam Vitals signs and nursing note reviewed.  Constitutional:      Appearance: Normal appearance. She is well-developed.  HENT:     Head: Atraumatic.     Nose: Nose normal.     Mouth/Throat:      Mouth: Mucous membranes are moist.  Eyes:     General: No scleral icterus.    Conjunctiva/sclera: Conjunctivae normal.  Neck:     Musculoskeletal: Normal range of motion and neck supple. No neck rigidity or muscular tenderness.     Trachea: No tracheal deviation.  Cardiovascular:     Rate and Rhythm: Normal rate and regular rhythm.     Pulses: Normal pulses.     Heart sounds: Normal heart sounds. No murmur. No friction rub. No gallop.   Pulmonary:     Effort: Pulmonary effort is normal. No respiratory distress.     Breath sounds: Normal breath sounds.  Abdominal:     General: Bowel sounds are normal. There is no distension.     Palpations: Abdomen is soft. There is no mass.     Tenderness: There is no abdominal tenderness. There is no guarding or rebound.     Hernia: No hernia is present.  Genitourinary:    Comments: No cva tenderness. Pelvic exam - normal external gu exam, no lesions. Scant whitish d/c. Cervix closed. No cmt. No adx masses/tenderness. Musculoskeletal:        General: No swelling.     Right lower leg: No edema.     Left lower leg: No edema.  Skin:    General: Skin is warm and dry.     Findings: No rash.  Neurological:     Mental Status: She is alert.     Comments: Alert, speech normal. Motor/sens grossly intact bil. Steady gait.  Psychiatric:        Mood and Affect: Mood normal.      ED Treatments / Results  Labs (all labs ordered are listed, but only abnormal results are displayed) Results for orders placed or performed during the hospital encounter of 06/21/18  Wet prep, genital  Result Value Ref Range   Yeast Wet Prep HPF POC NONE SEEN NONE SEEN   Trich, Wet Prep NONE SEEN NONE SEEN   Clue Cells Wet Prep HPF POC PRESENT (A) NONE SEEN   WBC, Wet Prep HPF POC MODERATE (A) NONE SEEN   Sperm NONE SEEN   Urinalysis, Routine w reflex microscopic  Result Value Ref Range   Color, Urine STRAW (A) YELLOW   APPearance CLEAR CLEAR   Specific Gravity, Urine  1.003 (L) 1.005 - 1.030   pH 6.0 5.0 - 8.0   Glucose, UA NEGATIVE NEGATIVE mg/dL   Hgb urine dipstick NEGATIVE NEGATIVE   Bilirubin Urine NEGATIVE NEGATIVE   Ketones, ur NEGATIVE NEGATIVE mg/dL   Protein, ur NEGATIVE NEGATIVE mg/dL   Nitrite NEGATIVE NEGATIVE   Leukocytes,Ua NEGATIVE NEGATIVE  POC Urine Pregnancy, ED (not at [facility])  Result Value Ref Range   Preg Test, Ur NEGATIVE NEGATIVE   US Transvaginal Non-ob  Result Date: 06/21/2018 CLINICAL DATA:  Pelvic pain for 3 months, no menses, history of cysts, question ovarian torsion EXAM: TRANSABDOMINAL AND TRANSVAGINAL ULTRASOUND OF PELVIS DOPPLER ULTRASOUND OF  OVARIES TECHNIQUE: Both transabdominal and transvaginal ultrasound examinations of the pelvis were performed. Transabdominal technique was performed for global imaging of the pelvis including uterus, ovaries, adnexal regions, and pelvic cul-de-sac. It was necessary to proceed with endovaginal exam following the transabdominal exam to visualize the uterus, endometrium, and ovaries. Color and duplex Doppler ultrasound was utilized to evaluate blood flow to the ovaries. COMPARISON:  CT abdomen pelvis 12/31/2013 FINDINGS: Uterus Measurements: 8.4 x 5.0 x 5.2 cm = volume: 116 mL. Anteverted. Normal morphology without mass. Small nabothian cysts at cervix. Endometrium Thickness: 8 mm, normal.  No endometrial fluid or focal abnormality Right ovary Measurements: 3.1 x 2.4 x 3.5 cm = volume: 13.8 mL. Dominant follicle without mass. Blood flow present within RIGHT ovary on color Doppler imaging. Left ovary Measurements: 3.0 x 1.7 x 2.6 cm = volume: 7.0 mL. Normal morphology without mass. Internal blood flow present on color Doppler imaging. Pulsed Doppler evaluation of both ovaries demonstrates normal low-resistance arterial and venous waveforms within the LEFT ovary. On the RIGHT, venous waveform is present but an adequate arterial waveform was not obtained. Doppler assessment of the RIGHT ovary is limited  by ovarian position posterior to the uterine fundus on transvaginal imaging. Other findings No free pelvic fluid or adnexal masses. IMPRESSION: Sonographically normal appearing uterus, endometrial complex and ovaries. Normal Doppler assessment of the LEFT ovary without evidence of torsion. Normal venous waveform within the RIGHT ovary but an adequate arterial waveform could not be obtained, felt to be technical in origin related to high position of the RIGHT ovary posterior to the uterine fundus on transvaginal imaging; no morphologic changes of the RIGHT ovary are seen on grayscale imaging to suggest sequela of ovarian torsion, recommend clinical correlation. Electronically Signed   By: Ulyses SouthwardMark  Boles M.D.   On: 06/21/2018 17:25   [place] Pelvis Complete  Result Date: 06/21/2018 CLINICAL DATA:  Pelvic pain for 3 months, no menses, history of cysts, question ovarian torsion EXAM: TRANSABDOMINAL AND TRANSVAGINAL ULTRASOUND OF PELVIS DOPPLER ULTRASOUND OF OVARIES TECHNIQUE: Both transabdominal and transvaginal ultrasound examinations of the pelvis were performed. Transabdominal technique was performed for global imaging of the pelvis including uterus, ovaries, adnexal regions, and pelvic cul-de-sac. It was necessary to proceed with endovaginal exam following the transabdominal exam to visualize the uterus, endometrium, and ovaries. Color and duplex Doppler ultrasound was utilized to evaluate blood flow to the ovaries. COMPARISON:  CT abdomen pelvis 12/31/2013 FINDINGS: Uterus Measurements: 8.4 x 5.0 x 5.2 cm = volume: 116 mL. Anteverted. Normal morphology without mass. Small nabothian cysts at cervix. Endometrium Thickness: 8 mm, normal.  No endometrial fluid or focal abnormality Right ovary Measurements: 3.1 x 2.4 x 3.5 cm = volume: 13.8 mL. Dominant follicle without mass. Blood flow present within RIGHT ovary on color Doppler imaging. Left ovary Measurements: 3.0 x 1.7 x 2.6 cm = volume: 7.0 mL. Normal morphology without  mass. Internal blood flow present on color Doppler imaging. Pulsed Doppler evaluation of both ovaries demonstrates normal low-resistance arterial and venous waveforms within the LEFT ovary. On the RIGHT, venous waveform is present but an adequate arterial waveform was not obtained. Doppler assessment of the RIGHT ovary is limited by ovarian position posterior to the uterine fundus on transvaginal imaging. Other findings No free pelvic fluid or adnexal masses. IMPRESSION: Sonographically normal appearing uterus, endometrial complex and ovaries. Normal Doppler assessment of the LEFT ovary without evidence of torsion. Normal venous waveform within the RIGHT ovary but an adequate arterial waveform could not be obtained,  felt to be technical in origin related to high position of the RIGHT ovary posterior to the uterine fundus on transvaginal imaging; no morphologic changes of the RIGHT ovary are seen on grayscale imaging to suggest sequela of ovarian torsion, recommend clinical correlation. Electronically Signed   By: Ulyses Southward M.D.   On: 06/21/2018 17:25   [place] Art/ven Flow Abd Pelv Doppler  Result Date: 06/21/2018 CLINICAL DATA:  Pelvic pain for 3 months, no menses, history of cysts, question ovarian torsion EXAM: TRANSABDOMINAL AND TRANSVAGINAL ULTRASOUND OF PELVIS DOPPLER ULTRASOUND OF OVARIES TECHNIQUE: Both transabdominal and transvaginal ultrasound examinations of the pelvis were performed. Transabdominal technique was performed for global imaging of the pelvis including uterus, ovaries, adnexal regions, and pelvic cul-de-sac. It was necessary to proceed with endovaginal exam following the transabdominal exam to visualize the uterus, endometrium, and ovaries. Color and duplex Doppler ultrasound was utilized to evaluate blood flow to the ovaries. COMPARISON:  CT abdomen pelvis 12/31/2013 FINDINGS: Uterus Measurements: 8.4 x 5.0 x 5.2 cm = volume: 116 mL. Anteverted. Normal morphology without mass. Small  nabothian cysts at cervix. Endometrium Thickness: 8 mm, normal.  No endometrial fluid or focal abnormality Right ovary Measurements: 3.1 x 2.4 x 3.5 cm = volume: 13.8 mL. Dominant follicle without mass. Blood flow present within RIGHT ovary on color Doppler imaging. Left ovary Measurements: 3.0 x 1.7 x 2.6 cm = volume: 7.0 mL. Normal morphology without mass. Internal blood flow present on color Doppler imaging. Pulsed Doppler evaluation of both ovaries demonstrates normal low-resistance arterial and venous waveforms within the LEFT ovary. On the RIGHT, venous waveform is present but an adequate arterial waveform was not obtained. Doppler assessment of the RIGHT ovary is limited by ovarian position posterior to the uterine fundus on transvaginal imaging. Other findings No free pelvic fluid or adnexal masses. IMPRESSION: Sonographically normal appearing uterus, endometrial complex and ovaries. Normal Doppler assessment of the LEFT ovary without evidence of torsion. Normal venous waveform within the RIGHT ovary but an adequate arterial waveform could not be obtained, felt to be technical in origin related to high position of the RIGHT ovary posterior to the uterine fundus on transvaginal imaging; no morphologic changes of the RIGHT ovary are seen on grayscale imaging to suggest sequela of ovarian torsion, recommend clinical correlation. Electronically Signed   By: Ulyses Southward M.D.   On: 06/21/2018 17:25    EKG None  Radiology US Transvaginal Non-ob  Result Date: 06/21/2018 CLINICAL DATA:  Pelvic pain for 3 months, no menses, history of cysts, question ovarian torsion EXAM: TRANSABDOMINAL AND TRANSVAGINAL ULTRASOUND OF PELVIS DOPPLER ULTRASOUND OF OVARIES TECHNIQUE: Both transabdominal and transvaginal ultrasound examinations of the pelvis were performed. Transabdominal technique was performed for global imaging of the pelvis including uterus, ovaries, adnexal regions, and pelvic cul-de-sac. It was necessary to  proceed with endovaginal exam following the transabdominal exam to visualize the uterus, endometrium, and ovaries. Color and duplex Doppler ultrasound was utilized to evaluate blood flow to the ovaries. COMPARISON:  CT abdomen pelvis 12/31/2013 FINDINGS: Uterus Measurements: 8.4 x 5.0 x 5.2 cm = volume: 116 mL. Anteverted. Normal morphology without mass. Small nabothian cysts at cervix. Endometrium Thickness: 8 mm, normal.  No endometrial fluid or focal abnormality Right ovary Measurements: 3.1 x 2.4 x 3.5 cm = volume: 13.8 mL. Dominant follicle without mass. Blood flow present within RIGHT ovary on color Doppler imaging. Left ovary Measurements: 3.0 x 1.7 x 2.6 cm = volume: 7.0 mL. Normal morphology without mass. Internal blood flow present  on color Doppler imaging. Pulsed Doppler evaluation of both ovaries demonstrates normal low-resistance arterial and venous waveforms within the LEFT ovary. On the RIGHT, venous waveform is present but an adequate arterial waveform was not obtained. Doppler assessment of the RIGHT ovary is limited by ovarian position posterior to the uterine fundus on transvaginal imaging. Other findings No free pelvic fluid or adnexal masses. IMPRESSION: Sonographically normal appearing uterus, endometrial complex and ovaries. Normal Doppler assessment of the LEFT ovary without evidence of torsion. Normal venous waveform within the RIGHT ovary but an adequate arterial waveform could not be obtained, felt to be technical in origin related to high position of the RIGHT ovary posterior to the uterine fundus on transvaginal imaging; no morphologic changes of the RIGHT ovary are seen on grayscale imaging to suggest sequela of ovarian torsion, recommend clinical correlation. Electronically Signed   By: Ulyses Southward M.D.   On: 06/21/2018 17:25   US Pelvis Complete  Result Date: 06/21/2018 CLINICAL DATA:  Pelvic pain for 3 months, no menses, history of cysts, question ovarian torsion EXAM:  TRANSABDOMINAL AND TRANSVAGINAL ULTRASOUND OF PELVIS DOPPLER ULTRASOUND OF OVARIES TECHNIQUE: Both transabdominal and transvaginal ultrasound examinations of the pelvis were performed. Transabdominal technique was performed for global imaging of the pelvis including uterus, ovaries, adnexal regions, and pelvic cul-de-sac. It was necessary to proceed with endovaginal exam following the transabdominal exam to visualize the uterus, endometrium, and ovaries. Color and duplex Doppler ultrasound was utilized to evaluate blood flow to the ovaries. COMPARISON:  CT abdomen pelvis 12/31/2013 FINDINGS: Uterus Measurements: 8.4 x 5.0 x 5.2 cm = volume: 116 mL. Anteverted. Normal morphology without mass. Small nabothian cysts at cervix. Endometrium Thickness: 8 mm, normal.  No endometrial fluid or focal abnormality Right ovary Measurements: 3.1 x 2.4 x 3.5 cm = volume: 13.8 mL. Dominant follicle without mass. Blood flow present within RIGHT ovary on color Doppler imaging. Left ovary Measurements: 3.0 x 1.7 x 2.6 cm = volume: 7.0 mL. Normal morphology without mass. Internal blood flow present on color Doppler imaging. Pulsed Doppler evaluation of both ovaries demonstrates normal low-resistance arterial and venous waveforms within the LEFT ovary. On the RIGHT, venous waveform is present but an adequate arterial waveform was not obtained. Doppler assessment of the RIGHT ovary is limited by ovarian position posterior to the uterine fundus on transvaginal imaging. Other findings No free pelvic fluid or adnexal masses. IMPRESSION: Sonographically normal appearing uterus, endometrial complex and ovaries. Normal Doppler assessment of the LEFT ovary without evidence of torsion. Normal venous waveform within the RIGHT ovary but an adequate arterial waveform could not be obtained, felt to be technical in origin related to high position of the RIGHT ovary posterior to the uterine fundus on transvaginal imaging; no morphologic changes of the  RIGHT ovary are seen on grayscale imaging to suggest sequela of ovarian torsion, recommend clinical correlation. Electronically Signed   By: Ulyses Southward M.D.   On: 06/21/2018 17:25   [place] Art/ven Flow Abd Pelv Doppler  Result Date: 06/21/2018 CLINICAL DATA:  Pelvic pain for 3 months, no menses, history of cysts, question ovarian torsion EXAM: TRANSABDOMINAL AND TRANSVAGINAL ULTRASOUND OF PELVIS DOPPLER ULTRASOUND OF OVARIES TECHNIQUE: Both transabdominal and transvaginal ultrasound examinations of the pelvis were performed. Transabdominal technique was performed for global imaging of the pelvis including uterus, ovaries, adnexal regions, and pelvic cul-de-sac. It was necessary to proceed with endovaginal exam following the transabdominal exam to visualize the uterus, endometrium, and ovaries. Color and duplex Doppler ultrasound was utilized to  evaluate blood flow to the ovaries. COMPARISON:  CT abdomen pelvis 12/31/2013 FINDINGS: Uterus Measurements: 8.4 x 5.0 x 5.2 cm = volume: 116 mL. Anteverted. Normal morphology without mass. Small nabothian cysts at cervix. Endometrium Thickness: 8 mm, normal.  No endometrial fluid or focal abnormality Right ovary Measurements: 3.1 x 2.4 x 3.5 cm = volume: 13.8 mL. Dominant follicle without mass. Blood flow present within RIGHT ovary on color Doppler imaging. Left ovary Measurements: 3.0 x 1.7 x 2.6 cm = volume: 7.0 mL. Normal morphology without mass. Internal blood flow present on color Doppler imaging. Pulsed Doppler evaluation of both ovaries demonstrates normal low-resistance arterial and venous waveforms within the LEFT ovary. On the RIGHT, venous waveform is present but an adequate arterial waveform was not obtained. Doppler assessment of the RIGHT ovary is limited by ovarian position posterior to the uterine fundus on transvaginal imaging. Other findings No free pelvic fluid or adnexal masses. IMPRESSION: Sonographically normal appearing uterus, endometrial complex and  ovaries. Normal Doppler assessment of the LEFT ovary without evidence of torsion. Normal venous waveform within the RIGHT ovary but an adequate arterial waveform could not be obtained, felt to be technical in origin related to high position of the RIGHT ovary posterior to the uterine fundus on transvaginal imaging; no morphologic changes of the RIGHT ovary are seen on grayscale imaging to suggest sequela of ovarian torsion, recommend clinical correlation. Electronically Signed   By: Ulyses Southward M.D.   On: 06/21/2018 17:25    Procedures Procedures (including critical care time)  Medications Ordered in ED Medications - No data to display   Initial Impression / Assessment and Plan / ED Course  I have reviewed the triage vital signs and the nursing notes.  Pertinent labs & imaging results that were available during my care of the patient were reviewed by me and considered in my medical decision making (see chart for details).  Labs sent.   Reviewed nursing notes and prior charts for additional history.   Labs reviewed by me - urine preg negative.   U/s pelvis pending.   U/s reviewed by me - no cysts. No large fibroids.   Acetaminophen po. Po fluids.  Recheck pt, no abd or pelvic pain. abd soft nt. Pt afebrile.   Pts symptoms currently resolved. Appears stable for d/c.   Rec outpt pcp/gyn f/u. Return precautions discussed/provided.     Final Clinical Impressions(s) / ED Diagnoses   Final diagnoses:  None    ED Discharge Orders    None       Cathren Laine, MD 06/21/18 1931

## 2018-06-21 NOTE — ED Notes (Signed)
Patient transported to Ultrasound 

## 2018-06-21 NOTE — ED Triage Notes (Signed)
Pt reports pelvic pain for the past couple days, pt has not had a menstrual cycle in 3 months. At times the pain radiates to her legs.

## 2018-09-14 ENCOUNTER — Encounter (HOSPITAL_COMMUNITY): Payer: Self-pay

## 2018-09-14 ENCOUNTER — Emergency Department (HOSPITAL_COMMUNITY)
Admission: EM | Admit: 2018-09-14 | Discharge: 2018-09-14 | Disposition: A | Discharge status: Home / Self Care | Payer: Medicaid Other | Attending: Emergency Medicine | Admitting: Emergency Medicine

## 2018-09-14 ENCOUNTER — Other Ambulatory Visit: Payer: Self-pay

## 2018-09-14 DIAGNOSIS — Z91040 Latex allergy status: Secondary | ICD-10-CM | POA: Diagnosis not present

## 2018-09-14 DIAGNOSIS — Y9289 Other specified places as the place of occurrence of the external cause: Secondary | ICD-10-CM | POA: Insufficient documentation

## 2018-09-14 DIAGNOSIS — Z79899 Other long term (current) drug therapy: Secondary | ICD-10-CM | POA: Diagnosis not present

## 2018-09-14 DIAGNOSIS — Y990 Civilian activity done for income or pay: Secondary | ICD-10-CM | POA: Diagnosis not present

## 2018-09-14 DIAGNOSIS — X500XXA Overexertion from strenuous movement or load, initial encounter: Secondary | ICD-10-CM | POA: Diagnosis not present

## 2018-09-14 DIAGNOSIS — S76302A Unspecified injury of muscle, fascia and tendon of the posterior muscle group at thigh level, left thigh, initial encounter: Secondary | ICD-10-CM

## 2018-09-14 DIAGNOSIS — S7012XA Contusion of left thigh, initial encounter: Secondary | ICD-10-CM | POA: Diagnosis not present

## 2018-09-14 DIAGNOSIS — Y9389 Activity, other specified: Secondary | ICD-10-CM | POA: Insufficient documentation

## 2018-09-14 MED ORDER — IBUPROFEN 400 MG PO TABS
600.0000 mg | ORAL_TABLET | Freq: Once | ORAL | Status: AC
  Administered 2018-09-14: 600 mg via ORAL
  Filled 2018-09-14: qty 1

## 2018-09-14 MED ORDER — HYDROCODONE-ACETAMINOPHEN 5-325 MG PO TABS: 1.0000 | ORAL_TABLET | Freq: Four times a day (QID) | ORAL | 0 refills | Status: DC | PRN

## 2018-09-14 MED ORDER — IBUPROFEN 600 MG PO TABS: 600.0000 mg | ORAL_TABLET | Freq: Four times a day (QID) | ORAL | 0 refills | Status: DC | PRN

## 2018-09-14 NOTE — ED Notes (Signed)
An ortho technician is currently applying an immobilization device for the patient before she goes home. The patient was able to repeat the instructions in a teach-back method to show her understanding. The patient is being taken home by a family member.

## 2018-09-14 NOTE — ED Provider Notes (Signed)
[facility] EMERGENCY DEPARTMENT Provider Note   CSN: [id number] Arrival date & time: 09/14/18  1116     History   Chief Complaint Chief Complaint  Patient presents with  . Leg Pain    left    HPI Ann Dougherty is a 48 y.o. female.     Ann Dougherty is a 48 y.o. female with a history of fibromyalgia, who presents to the emergency department for evaluation of injury to her left leg.  She reports yesterday afternoon while at work she attempted to do a "jump split" where she jumped into the air put her left leg forward and right leg back.  She reports that while in the air she felt like something tore was injured and when she landed she had pain and fell to the side.  She reports since then she has continued to have pain over the posterior distal thigh on her left leg and she noticed a large area of bruising.  She reports pain is worse when she tries to fully extend her leg or bend forward.  She reports that when she is sitting pain is fairly mild.  She denies any numbness weakness or tingling.  No pain or swelling over the knee.  No prior injury or surgeries to the left leg.  She has not had anything for pain prior to arrival.  No other aggravating or alleviating factors.     Past Medical History:  Diagnosis Date  . Fibromyalgia     There are no active problems to display for this patient.   History reviewed. No pertinent surgical history.   OB History   No obstetric history on file.      Home Medications    Prior to Admission medications   Medication Sig Start Date End Date Taking? Authorizing Provider  amoxicillin (AMOXIL) 500 MG capsule Take 1 capsule (500 mg total) by mouth 3 (three) times daily. 06/30/14   Kirichenko, Lahoma Rocker, PA-C  DM-Phenylephrine-Acetaminophen (ALKA-SELTZER PLS SINUS & COUGH) 10-5-325 MG CAPS Take 1 capsule by mouth daily as needed (cough, congestion).    [provider]  GARLIC PO Take 1 tablet by mouth daily.     [provider]  HYDROcodone-acetaminophen (NORCO) 5-325 MG tablet Take 1 tablet by mouth every 6 (six) hours as needed. 09/14/18   Jacqlyn Larsen, PA-C  ibuprofen (ADVIL) 600 MG tablet Take 1 tablet (600 mg total) by mouth every 6 (six) hours as needed. 09/14/18   Jacqlyn Larsen, PA-C  methocarbamol (ROBAXIN) 500 MG tablet Take 1 tablet (500 mg total) by mouth 2 (two) times daily. Patient not taking: Reported on 12/31/2013 10/19/11   Muthersbaugh, Jarrett Soho, PA-C  metroNIDAZOLE (FLAGYL) 500 MG tablet Take 1 tablet (500 mg total) by mouth 2 (two) times daily. 06/21/18   Lajean Saver, MD  naproxen (NAPROSYN) 500 MG tablet Take 1 tablet (500 mg total) by mouth 2 (two) times daily with a meal. Patient not taking: Reported on 12/31/2013 10/19/11   Muthersbaugh, Jarrett Soho, PA-C  ondansetron (ZOFRAN) 4 MG tablet Take 1 tablet (4 mg total) by mouth every 6 (six) hours. Patient not taking: Reported on 06/30/2014 12/31/13   Hyman Bible, PA-C    Family History No family history on file.  Social History Social History   Tobacco Use  . Smoking status: Never Smoker  . Smokeless tobacco: Never Used  Substance Use Topics  . Alcohol use: Yes    Comment: rarely   . Drug use: Yes  Types: Marijuana    Comment: Daily use for Fibromyalgia pain     Allergies   Latex   Review of Systems Review of Systems  Constitutional: Negative for chills and fever.  Musculoskeletal: Positive for myalgias.  Skin: Positive for color change. Negative for rash.  Neurological: Negative for weakness and numbness.     Physical Exam Updated Vital Signs BP (!) 152/106 (BP Location: Right Arm)   Pulse 84   Temp 98 F (36.7 C) (Oral)   Resp 16   Ht 5\' 4"  (1.626 m)   Wt 77 kg   SpO2 100%   BMI 29.14 kg/m   Physical Exam Vitals signs and nursing note reviewed.  Constitutional:      General: She is not in acute distress.    Appearance: Normal appearance. She is well-developed and normal weight. She is  not ill-appearing or diaphoretic.  HENT:     Head: Normocephalic and atraumatic.  Eyes:     General:        Right eye: No discharge.        Left eye: No discharge.  Pulmonary:     Effort: Pulmonary effort is normal. No respiratory distress.  Musculoskeletal:       Legs:     Comments: Tenderness to palpation over the distal posterior left thigh with overlying ecchymosis, the distal hamstring tendons are palpable and appear to be intact.  Bruising and swelling does not extend to the posterior knee.  There is no bony tenderness over the knee and no joint laxity.  No calf pain, swelling or tenderness.  No pain or tenderness at the proximal insertion of the hamstrings.  2+ DP and TP pulses, normal sensation and strength.  Skin:    General: Skin is warm and dry.  Neurological:     Mental Status: She is alert.     Coordination: Coordination normal.  Psychiatric:        Mood and Affect: Mood normal.        Behavior: Behavior normal.      ED Treatments / Results  Labs (all labs ordered are listed, but only abnormal results are displayed) Labs Reviewed - No data to display  EKG None  Radiology No results found.  Procedures Procedures (including critical care time)  Medications Ordered in ED Medications  ibuprofen (ADVIL) tablet 600 mg (600 mg Oral Given 09/14/18 1343)     Initial Impression / Assessment and Plan / ED Course  I have reviewed the triage vital signs and the nursing notes.  Pertinent labs & imaging results that were available during my care of the patient were reviewed by me and considered in my medical decision making (see chart for details).  49 year old female presents with pain and bruising over the left posterior distal thigh after trying to do a split yesterday.  Exam with bruising and swelling concerning for possible muscle tear, distal hamstring tendons are intact and there is no bony tenderness over the knee or joint laxity.  I discussed case with Dr.  Susa Simmonds with orthopedics, given that distal tendons are palpable and intact without obvious defect, he reported that the need for surgical repair is unlikely, there is likely a muscle tear more proximally at the musculotendinous junction, these typically heal on their own, he recommends knee immobilizer, and will see the patient in about a week to see how she is improving, if still having pain would consider MRI at that point.  Discussed this plan with the patient and she  expresses understanding and agreement.  Pain treated here in the ED, will discharge with NSAIDs and short course of narcotics as needed for breakthrough pain.  Orthotec place patient in knee immobilizer with slight bend, crutches provided as well.  Discussed follow-up and return precautions with the patient.  She expresses understanding and agreement with plan.  Discharged home in good condition.  Final Clinical Impressions(s) / ED Diagnoses   Final diagnoses:  Left hamstring injury, initial encounter    ED Discharge Orders         Ordered    ibuprofen (ADVIL) 600 MG tablet  Every 6 hours PRN     09/14/18 1332    HYDROcodone-acetaminophen (NORCO) 5-325 MG tablet  Every 6 hours PRN     09/14/18 1332           Dartha LodgeFord, Khrystian Schauf N, [place] JerseyPA-C 09/14/18 1554    Rolan BuccoBelfi, Melanie, MD 09/14/18 [phone]

## 2018-09-14 NOTE — Discharge Instructions (Addendum)
You have likely injured her hamstring muscle, please use knee immobilizer and crutches, weightbearing as tolerated.  Motrin and Tylenol every 6 hours for pain, Norco as needed for breakthrough pain, this medication can cause drowsiness, do not take while driving or mix with alcohol.  You will need to follow-up with Dr. Lucia Gaskins with orthopedics in about a week, call Monday morning to schedule appointment.

## 2018-09-14 NOTE — Progress Notes (Signed)
Orthopedic Tech Progress Note Patient Details:  Ann Dougherty [birth year] [id number] Applied watson jones dressing because patient couldn't stretch leg without hurting while the ED [facility] applied it 1st. So I asked 1st just to make sure it would be ok. Patient was very happy once I finished  Ortho Devices Type of Ortho Device: Watson Jones splint, Knee Immobilizer, Crutches Ortho Device/Splint Location: LLE Ortho Device/Splint Interventions: Adjustment, Application, Ordered   Post Interventions Patient Tolerated: Well, Ambulated well Instructions Provided: Care of device, Poper ambulation with device, Adjustment of device   [name] 09/14/2018, 2:24 PM

## 2018-09-14 NOTE — ED Triage Notes (Signed)
Pt states she came to the ER due to leg pain. The pt states she did a "jump split" and felt a pain in her right leg that was intense enough to make her concerned.

## 2018-10-29 ENCOUNTER — Other Ambulatory Visit: Payer: Self-pay | Admitting: Adult Health Nurse Practitioner

## 2018-10-29 DIAGNOSIS — Z1231 Encounter for screening mammogram for malignant neoplasm of breast: Secondary | ICD-10-CM

## 2018-11-08 ENCOUNTER — Other Ambulatory Visit: Payer: Self-pay

## 2018-11-08 ENCOUNTER — Encounter: Payer: Self-pay | Admitting: Physical Therapy

## 2018-11-08 ENCOUNTER — Ambulatory Visit: Payer: Medicaid Other | Attending: Adult Health Nurse Practitioner | Admitting: Physical Therapy

## 2018-11-08 DIAGNOSIS — M6281 Muscle weakness (generalized): Secondary | ICD-10-CM | POA: Insufficient documentation

## 2018-11-08 DIAGNOSIS — R279 Unspecified lack of coordination: Secondary | ICD-10-CM | POA: Insufficient documentation

## 2018-11-08 DIAGNOSIS — R252 Cramp and spasm: Secondary | ICD-10-CM

## 2018-11-08 NOTE — Patient Instructions (Signed)
Access Code: [email or web address]  URL: [email or web address]  Date: 11/08/2018  Prepared by: Venetia Night Beuhring   Exercises  Seated Diaphragmatic Breathing - 1x daily - 7x weekly  Supine Diaphragmatic Breathing - 1x daily - 7x weekly
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
## 2018-11-29 ENCOUNTER — Ambulatory Visit: Payer: Medicaid Other | Attending: Adult Health Nurse Practitioner | Admitting: Physical Therapy

## 2018-11-29 ENCOUNTER — Encounter: Payer: Self-pay | Admitting: Physical Therapy

## 2018-11-29 ENCOUNTER — Other Ambulatory Visit: Payer: Self-pay

## 2018-11-29 DIAGNOSIS — R252 Cramp and spasm: Secondary | ICD-10-CM | POA: Insufficient documentation

## 2018-11-29 DIAGNOSIS — M6281 Muscle weakness (generalized): Secondary | ICD-10-CM

## 2018-11-29 DIAGNOSIS — R279 Unspecified lack of coordination: Secondary | ICD-10-CM | POA: Diagnosis present

## 2018-11-29 NOTE — Patient Instructions (Signed)
Access Code: [email or web address]  URL: [email or web address]  Date: 11/29/2018  Prepared by: Venetia Night Beuhring   Exercises  Supine Diaphragmatic Breathing - 1x daily - 7x weekly  Child's Pose Stretch - 3 reps - 1 sets - 60 hold - 2x daily - 7x weekly  Stride Stance Weight Shift - 10 reps - 1 sets - 5 hold - 2x daily - 7x weekly  Staggered Stance Step Throughs - 10 reps - 1 sets - 2x daily - 7x weekly
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
## 2018-12-06 ENCOUNTER — Ambulatory Visit: Payer: Medicaid Other | Admitting: Physical Therapy

## 2018-12-20 ENCOUNTER — Encounter: Payer: Self-pay | Admitting: Physical Therapy

## 2018-12-20 ENCOUNTER — Ambulatory Visit: Payer: Medicaid Other | Attending: Adult Health Nurse Practitioner | Admitting: Physical Therapy

## 2018-12-20 ENCOUNTER — Other Ambulatory Visit: Payer: Self-pay

## 2018-12-20 DIAGNOSIS — R279 Unspecified lack of coordination: Secondary | ICD-10-CM

## 2018-12-20 DIAGNOSIS — M6281 Muscle weakness (generalized): Secondary | ICD-10-CM | POA: Diagnosis present

## 2018-12-20 DIAGNOSIS — R252 Cramp and spasm: Secondary | ICD-10-CM | POA: Insufficient documentation
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
## 2018-12-20 NOTE — Patient Instructions (Signed)
  Access Code: [email or web address]  URL: [email or web address]  Date: 12/20/2018  Prepared by: Venetia Night Brighid Koch   Exercises  Supine Diaphragmatic Breathing - 1x daily - 7x weekly  Child's Pose Stretch - 3 reps - 1 sets - 60 hold - 2x daily - 7x weekly  Stride Stance Weight Shift - 10 reps - 1 sets - 5 hold - 2x daily - 7x weekly  Staggered Stance Step Throughs - 10 reps - 1 sets - 2x daily - 7x weekly  Supine Butterfly Groin Stretch - 2 reps - 3 sets - 60 hold - 2x daily - 7x weekly  Supine Piriformis Stretch with Foot on Ground - 3 reps - 2 sets - 30 hold - 1x daily - 7x weekly  Sidelying Transversus Abdominis Bracing - 10 reps - 3 sets - 1x daily - 7x weekly  Thomas Stretch on Table - 3 reps - 3 sets - 30 hold - 1x daily - 7x weekly   [email or web address] for pelvic meditation

## 2018-12-24 ENCOUNTER — Encounter: Payer: Self-pay | Admitting: Physical Therapy

## 2018-12-24 ENCOUNTER — Other Ambulatory Visit: Payer: Self-pay

## 2018-12-24 ENCOUNTER — Ambulatory Visit: Payer: Medicaid Other | Admitting: Physical Therapy

## 2018-12-24 DIAGNOSIS — M6281 Muscle weakness (generalized): Secondary | ICD-10-CM

## 2018-12-24 DIAGNOSIS — R279 Unspecified lack of coordination: Secondary | ICD-10-CM

## 2018-12-24 DIAGNOSIS — R252 Cramp and spasm: Secondary | ICD-10-CM

## 2018-12-24 NOTE — Patient Instructions (Signed)
   The "Pelvic Drop" to Release Pelvic Floor Tension: Three Visualizations     Guided Meditation for Pelvic Floor Relaxation  [employer]   Pelvic Floor Release Stretches  [employer]    Pelvic Floor Release Stretches (NEW)  [employer]   
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
## 2019-01-06 ENCOUNTER — Ambulatory Visit: Payer: Medicaid Other | Admitting: Physical Therapy

## 2019-01-06 ENCOUNTER — Encounter: Payer: Self-pay | Admitting: Physical Therapy

## 2019-01-06 ENCOUNTER — Other Ambulatory Visit: Payer: Self-pay

## 2019-01-06 DIAGNOSIS — M6281 Muscle weakness (generalized): Secondary | ICD-10-CM | POA: Diagnosis not present

## 2019-01-06 DIAGNOSIS — R252 Cramp and spasm: Secondary | ICD-10-CM

## 2019-01-06 DIAGNOSIS — R279 Unspecified lack of coordination: Secondary | ICD-10-CM

## 2019-01-06 NOTE — Patient Instructions (Signed)
Access Code: [email or web address]  URL: [email or web address]  Date: 01/06/2019  Prepared by: Venetia Night Labrisha Wuellner   Exercises  Supine Diaphragmatic Breathing - 1x daily - 7x weekly  Child's Pose Stretch - 3 reps - 1 sets - 60 hold - 2x daily - 7x weekly  Stride Stance Weight Shift - 10 reps - 1 sets - 5 hold - 2x daily - 7x weekly  Staggered Stance Step Throughs - 10 reps - 1 sets - 2x daily - 7x weekly  Supine Butterfly Groin Stretch - 2 reps - 3 sets - 60 hold - 2x daily - 7x weekly  Supine Piriformis Stretch with Foot on Ground - 3 reps - 2 sets - 30 hold - 1x daily - 7x weekly  Sidelying Transversus Abdominis Bracing - 10 reps - 3 sets - 1x daily - 7x weekly  Thomas Stretch on Table - 3 reps - 3 sets - 30 hold - 1x daily - 7x weekly  Seated Hip Abduction with Resistance - 10 reps - 3 sets - 1x daily - 7x weekly  Clamshell - 10 reps - 3 sets - 1x daily - 7x weekly  Sit to Stand without Arm Support - 5 reps - 3 sets - 1x daily - 7x weekly
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
## 2019-01-24 ENCOUNTER — Ambulatory Visit: Payer: Medicaid Other | Admitting: Physical Therapy

## 2019-01-31 ENCOUNTER — Encounter: Payer: Self-pay | Admitting: Physical Therapy

## 2019-01-31 ENCOUNTER — Other Ambulatory Visit: Payer: Self-pay

## 2019-01-31 ENCOUNTER — Ambulatory Visit: Payer: Medicaid Other | Attending: Adult Health Nurse Practitioner | Admitting: Physical Therapy

## 2019-01-31 DIAGNOSIS — M6281 Muscle weakness (generalized): Secondary | ICD-10-CM | POA: Diagnosis not present

## 2019-01-31 DIAGNOSIS — R252 Cramp and spasm: Secondary | ICD-10-CM

## 2019-01-31 DIAGNOSIS — R279 Unspecified lack of coordination: Secondary | ICD-10-CM | POA: Diagnosis present
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
## 2019-02-07 ENCOUNTER — Other Ambulatory Visit: Payer: Self-pay

## 2019-02-07 ENCOUNTER — Ambulatory Visit: Payer: Medicaid Other | Admitting: Physical Therapy

## 2019-02-07 ENCOUNTER — Encounter: Payer: Self-pay | Admitting: Physical Therapy

## 2019-02-07 DIAGNOSIS — M6281 Muscle weakness (generalized): Secondary | ICD-10-CM

## 2019-02-07 DIAGNOSIS — R252 Cramp and spasm: Secondary | ICD-10-CM

## 2019-02-07 DIAGNOSIS — R279 Unspecified lack of coordination: Secondary | ICD-10-CM
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
## 2019-04-07 ENCOUNTER — Other Ambulatory Visit: Payer: Self-pay

## 2019-04-07 ENCOUNTER — Encounter (HOSPITAL_COMMUNITY): Payer: Self-pay | Admitting: Emergency Medicine

## 2019-04-07 ENCOUNTER — Ambulatory Visit (HOSPITAL_COMMUNITY)
Admission: EM | Admit: 2019-04-07 | Discharge: 2019-04-07 | Disposition: A | Discharge status: Home / Self Care | Payer: Medicaid Other | Attending: Internal Medicine | Admitting: Internal Medicine

## 2019-04-07 DIAGNOSIS — M778 Other enthesopathies, not elsewhere classified: Secondary | ICD-10-CM | POA: Diagnosis not present

## 2019-04-07 MED ORDER — PREDNISONE 20 MG PO TABS: 20.0000 mg | ORAL_TABLET | Freq: Every day | ORAL | 0 refills | Status: AC

## 2019-04-07 MED ORDER — IBUPROFEN 600 MG PO TABS: 600.0000 mg | ORAL_TABLET | Freq: Four times a day (QID) | ORAL | 0 refills | Status: DC | PRN

## 2019-04-07 NOTE — ED Provider Notes (Signed)
[place]    CSN: [id number] Arrival date & time: 04/07/19  1257      History   Chief Complaint Chief Complaint  Patient presents with  . Hand Pain    HPI Ann Dougherty is a 49 y.o. female comes to urgent care with 1 day history of left thumb pain which started yesterday morning.  Onset of pain was fairly sudden, currently severe, no known relieving factors, aggravated by moving her fingers.  Pain is nonradiating.  She denies any trauma to the finger.  No fever, chills, no discharge or numbness of the finger. No previous episodes of such symptoms.  HPI  Past Medical History:  Diagnosis Date  . Fibromyalgia     There are no problems to display for this patient.   History reviewed. No pertinent surgical history.  OB History   No obstetric history on file.      Home Medications    Prior to Admission medications   Medication Sig Start Date End Date Taking? Authorizing Provider  COLLAGEN PO Take by mouth.   Yes [provider]  DM-Phenylephrine-Acetaminophen (ALKA-SELTZER PLS SINUS & COUGH) 10-5-325 MG CAPS Take 1 capsule by mouth daily as needed (cough, congestion).    [provider]  GARLIC PO Take 1 tablet by mouth daily.    [provider]  ibuprofen (ADVIL) 600 MG tablet Take 1 tablet (600 mg total) by mouth every 6 (six) hours as needed. 04/07/19   Makailah Slavick, Myrene Galas, MD  predniSONE (DELTASONE) 20 MG tablet Take 1 tablet (20 mg total) by mouth daily for 3 days. 04/07/19 04/10/19  Chase Picket, MD    Family History Family History  Problem Relation Age of Onset  . Hypertension Mother   . Cancer Mother     Social History Social History   Tobacco Use  . Smoking status: Never Smoker  . Smokeless tobacco: Never Used  Substance Use Topics  . Alcohol use: Yes    Comment: rarely   . Drug use: Yes    Types: Marijuana    Comment: Daily use for Fibromyalgia pain     Allergies   Latex   Review of Systems Review  of Systems  Constitutional: Negative for activity change, chills, fever and unexpected weight change.  Musculoskeletal: Positive for arthralgias and joint swelling. Negative for gait problem, myalgias, neck pain and neck stiffness.  Neurological: Negative.  Negative for dizziness and light-headedness.     Physical Exam Triage Vital Signs ED Triage Vitals  Enc Vitals Group     BP 04/07/19 1356 132/82     Pulse Rate 04/07/19 1356 79     Resp 04/07/19 1356 18     Temp 04/07/19 1356 98.1 F (36.7 C)     Temp Source 04/07/19 1356 Oral     SpO2 04/07/19 1356 98 %     Weight --      Height --      Head Circumference --      Peak Flow --      Pain Score 04/07/19 1352 8     Pain Loc --      Pain Edu? --      Excl. in [place]? --    No data found.  Updated Vital Signs BP 132/82 (BP Location: Right Arm)   Pulse 79   Temp 98.1 F (36.7 C) (Oral)   Resp 18   LMP 04/02/2019   SpO2 98%   Visual Acuity Right Eye Distance:  Left Eye Distance:   Bilateral Distance:    Right Eye Near:   Left Eye Near:    Bilateral Near:     Physical Exam Vitals and nursing note reviewed.  Constitutional:      General: She is in acute distress.     Appearance: Normal appearance. She is not ill-appearing.  Cardiovascular:     Rate and Rhythm: Normal rate and regular rhythm.  Musculoskeletal:        General: Normal range of motion.     Comments: Left thumb swelling.  No erythema.  Tenderness to palpation over the flexor tendons of the left distal finger.  Limited range of motion secondary to swelling/pain  Neurological:     Mental Status: She is alert.      UC Treatments / Results  Labs (all labs ordered are listed, but only abnormal results are displayed) Labs Reviewed - No data to display  EKG   Radiology No results found.  Procedures Procedures (including critical care time)  Medications Ordered in UC Medications - No data to display  Initial Impression / Assessment and Plan /  UC Course  I have reviewed the triage vital signs and the nursing notes.  Pertinent labs & imaging results that were available during my care of the patient were reviewed by me and considered in my medical decision making (see chart for details).     1.  Tendinitis of the left arm (flexor tendons involved): Ibuprofen 600 mg every 6 hours as needed for pain Prednisone 20 mg orally daily for 3 days Thumb spica Icing of the joints Gentle range of motion exercises Return precautions given. Final Clinical Impressions(s) / UC Diagnoses   Final diagnoses:  Tendinitis of thumb   Discharge Instructions   None    ED Prescriptions    Medication Sig Dispense Auth. Provider   ibuprofen (ADVIL) 600 MG tablet Take 1 tablet (600 mg total) by mouth every 6 (six) hours as needed. 30 tablet Malaney Mcbean, Britta Mccreedy, MD   predniSONE (DELTASONE) 20 MG tablet Take 1 tablet (20 mg total) by mouth daily for 3 days. 3 tablet Jacklyn Branan, Britta Mccreedy, MD     PDMP not reviewed this encounter.   Merrilee Jansky, MD 04/07/19 [phone]

## 2019-04-07 NOTE — ED Triage Notes (Signed)
Pain and swelling to left thumb.  No known injury.  Patient has been massaging thumb, using ice and heat

## 2019-05-12 ENCOUNTER — Other Ambulatory Visit: Payer: Self-pay | Admitting: Family Medicine

## 2019-05-12 DIAGNOSIS — S76312D Strain of muscle, fascia and tendon of the posterior muscle group at thigh level, left thigh, subsequent encounter: Secondary | ICD-10-CM

## 2019-06-07 ENCOUNTER — Inpatient Hospital Stay: Admission: RE | Admit: 2019-06-07 | Payer: Medicaid Other | Source: Ambulatory Visit

## 2019-07-11 ENCOUNTER — Other Ambulatory Visit: Payer: Self-pay

## 2019-07-11 ENCOUNTER — Ambulatory Visit
Admission: RE | Admit: 2019-07-11 | Discharge: 2019-07-11 | Disposition: A | Discharge status: Home / Self Care | Payer: Self-pay | Source: Ambulatory Visit | Attending: Family Medicine | Admitting: Family Medicine

## 2019-07-11 DIAGNOSIS — S76312D Strain of muscle, fascia and tendon of the posterior muscle group at thigh level, left thigh, subsequent encounter: Secondary | ICD-10-CM

## 2020-05-18 LAB — COLOGUARD

## 2020-07-24 LAB — COLOGUARD: COLOGUARD: NEGATIVE

## 2020-09-07 DIAGNOSIS — M546 Pain in thoracic spine: Secondary | ICD-10-CM | POA: Insufficient documentation

## 2020-09-07 DIAGNOSIS — R079 Chest pain, unspecified: Secondary | ICD-10-CM | POA: Insufficient documentation

## 2020-09-07 DIAGNOSIS — M542 Cervicalgia: Secondary | ICD-10-CM | POA: Insufficient documentation

## 2020-09-07 DIAGNOSIS — Z5321 Procedure and treatment not carried out due to patient leaving prior to being seen by health care provider: Secondary | ICD-10-CM | POA: Diagnosis not present

## 2020-09-08 ENCOUNTER — Other Ambulatory Visit: Payer: Self-pay

## 2020-09-08 ENCOUNTER — Emergency Department (HOSPITAL_COMMUNITY)
Admission: EM | Admit: 2020-09-08 | Discharge: 2020-09-08 | Disposition: A | Discharge status: Home / Self Care | Payer: Medicaid Other | Attending: Emergency Medicine | Admitting: Emergency Medicine

## 2020-09-08 ENCOUNTER — Emergency Department (HOSPITAL_COMMUNITY): Payer: Medicaid Other

## 2020-09-08 LAB — CBC WITH DIFFERENTIAL/PLATELET
Abs Immature Granulocytes: 0.03 10*3/uL (ref 0.00–0.07)
Basophils Absolute: 0.1 10*3/uL (ref 0.0–0.1)
Basophils Relative: 1 %
Eosinophils Absolute: 0.3 10*3/uL (ref 0.0–0.5)
Eosinophils Relative: 4 %
HCT: 39.7 % (ref 36.0–46.0)
Hemoglobin: 13.4 g/dL (ref 12.0–15.0)
Immature Granulocytes: 0 %
Lymphocytes Relative: 34 %
Lymphs Abs: 2.9 10*3/uL (ref 0.7–4.0)
MCH: 33.1 pg (ref 26.0–34.0)
MCHC: 33.8 g/dL (ref 30.0–36.0)
MCV: 98 fL (ref 80.0–100.0)
Monocytes Absolute: 0.8 10*3/uL (ref 0.1–1.0)
Monocytes Relative: 9 %
Neutro Abs: 4.5 10*3/uL (ref 1.7–7.7)
Neutrophils Relative %: 52 %
Platelets: 215 10*3/uL (ref 150–400)
RBC: 4.05 MIL/uL (ref 3.87–5.11)
RDW: 12.9 % (ref 11.5–15.5)
WBC: 8.6 10*3/uL (ref 4.0–10.5)
nRBC: 0 % (ref 0.0–0.2)

## 2020-09-08 LAB — BASIC METABOLIC PANEL
Anion gap: 7 (ref 5–15)
BUN: 13 mg/dL (ref 6–20)
CO2: 26 mmol/L (ref 22–32)
Calcium: 9.2 mg/dL (ref 8.9–10.3)
Chloride: 106 mmol/L (ref 98–111)
Creatinine, Ser: 0.81 mg/dL (ref 0.44–1.00)
GFR, Estimated: >60 mL/min (ref >60)
Glucose, Bld: 117 mg/dL — ABNORMAL HIGH (ref 70–99)
Potassium: 4.2 mmol/L (ref 3.5–5.1)
Sodium: 139 mmol/L (ref 135–145)

## 2020-09-08 LAB — TROPONIN I (HIGH SENSITIVITY)
Troponin I (High Sensitivity): 10 ng/L (ref <18)
Troponin I (High Sensitivity): 10 ng/L (ref <18)

## 2020-09-08 NOTE — ED Provider Notes (Signed)
Emergency Medicine Provider Triage Evaluation Note  Ann Dougherty , a 50 y.o. female  was evaluated in triage.  Pt complains of chest pain.  The patient reports chest pain, neck pain, and upper back pain that began earlier tonight while she was watching TV.  She feels that the pain may began in her back and radiate through to her chest.  Pain is worse with taking deep breaths.  She denies shortness of breath, cough, fever, chills, nausea, vomiting, diarrhea, abdominal pain, numbness, weakness.  No history of similar.  She does report that she was exercising regularly until she had an injury to her hamstring.  She began exercising again over the last few weeks.  She denies any recent falls or injuries.  Review of Systems  Positive: Chest pain, neck pain, back pain Negative: Cough, fever, chills, sore throat, palpitations, leg swelling, abdominal pain, nausea, vomiting, diarrhea, rash, headache, visual changes  Physical Exam  BP (!) 159/101 (BP Location: Right Arm)   Pulse 88   Temp 98.4 F (36.9 C) (Oral)   Resp 17   SpO2 92%  Gen:   Awake, no distress   Resp:  Normal effort  MSK:   Moves extremities without difficulty  Other:  Heart is regular rate and rhythm without murmurs rubs or gallops.   Medical Decision Making  Medically screening exam initiated at 12:10 AM.  Appropriate orders placed.  Simren Popson was informed that the remainder of the evaluation will be completed by another provider, this initial triage assessment does not replace that evaluation, and the importance of remaining in the ED until their evaluation is complete.  Labs and imaging have been initiated in the ED.  She will require further work-up and evaluation in the emergency department.   Barkley Boards, PA-C 09/08/20 0118    Shon Baton, MD 09/08/20 [phone]

## 2020-09-08 NOTE — ED Triage Notes (Signed)
Pt states she was lying watching tv when she started having chest, back,and neck pain.

## 2020-09-08 NOTE — ED Notes (Signed)
Called pt x3 for vitals recheck, no response. Will try again in a few minutes.

## 2020-12-13 ENCOUNTER — Other Ambulatory Visit: Payer: Self-pay

## 2020-12-13 DIAGNOSIS — R059 Cough, unspecified: Secondary | ICD-10-CM | POA: Diagnosis present

## 2020-12-13 DIAGNOSIS — J101 Influenza due to other identified influenza virus with other respiratory manifestations: Secondary | ICD-10-CM | POA: Diagnosis not present

## 2020-12-13 DIAGNOSIS — Z91040 Latex allergy status: Secondary | ICD-10-CM | POA: Diagnosis not present

## 2020-12-13 DIAGNOSIS — Z20822 Contact with and (suspected) exposure to covid-19: Secondary | ICD-10-CM | POA: Insufficient documentation

## 2020-12-13 LAB — RESP PANEL BY RT-PCR (FLU A&B, COVID) ARPGX2
Influenza A by PCR: POSITIVE — AB
Influenza B by PCR: NEGATIVE
SARS Coronavirus 2 by RT PCR: NEGATIVE
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
## 2020-12-14 ENCOUNTER — Encounter (HOSPITAL_BASED_OUTPATIENT_CLINIC_OR_DEPARTMENT_OTHER): Payer: Self-pay | Admitting: Emergency Medicine

## 2020-12-14 ENCOUNTER — Emergency Department (HOSPITAL_BASED_OUTPATIENT_CLINIC_OR_DEPARTMENT_OTHER)
Admission: EM | Admit: 2020-12-14 | Discharge: 2020-12-14 | Disposition: A | Discharge status: Home / Self Care | Payer: Medicaid Other | Attending: Emergency Medicine | Admitting: Emergency Medicine

## 2020-12-14 DIAGNOSIS — J101 Influenza due to other identified influenza virus with other respiratory manifestations: Secondary | ICD-10-CM

## 2020-12-14 MED ORDER — IBUPROFEN 800 MG PO TABS
ORAL_TABLET | ORAL | Status: AC
  Filled 2020-12-14: qty 1

## 2020-12-14 MED ORDER — IBUPROFEN 800 MG PO TABS
800.0000 mg | ORAL_TABLET | Freq: Once | ORAL | Status: AC
  Administered 2020-12-14: 800 mg via ORAL

## 2020-12-14 MED ORDER — ACETAMINOPHEN 500 MG PO TABS
1000.0000 mg | ORAL_TABLET | Freq: Once | ORAL | Status: AC
  Administered 2020-12-14: 1000 mg via ORAL

## 2020-12-14 MED ORDER — ACETAMINOPHEN 500 MG PO TABS
ORAL_TABLET | ORAL | Status: AC
  Filled 2020-12-14: qty 2

## 2020-12-14 NOTE — ED Provider Notes (Signed)
[facility] EMERGENCY DEPT Provider Note   CSN: [id number] Arrival date & time: 12/13/20  1928     History Chief Complaint  Patient presents with   Cough   Nasal Congestion    Ann Dougherty is a 50 y.o. female.  The history is provided by the patient.  Cough Cough characteristics:  Non-productive Severity:  Moderate Onset quality:  Gradual Duration:  4 days Timing:  Sporadic Progression:  Unchanged Chronicity:  New Context: upper respiratory infection   Relieved by:  Nothing Worsened by:  Nothing Ineffective treatments:  None tried Associated symptoms: fever and myalgias   Associated symptoms: no ear pain, no eye discharge, no rash and no sinus congestion   Risk factors: no chemical exposure       Past Medical History:  Diagnosis Date   Fibromyalgia     There are no problems to display for this patient.   History reviewed. No pertinent surgical history.   OB History   No obstetric history on file.     Family History  Problem Relation Age of Onset   Hypertension Mother    Cancer Mother     Social History   Tobacco Use   Smoking status: Never   Smokeless tobacco: Never  Substance Use Topics   Alcohol use: Yes    Comment: rarely    Drug use: Yes    Types: Marijuana    Comment: Daily use for Fibromyalgia pain    Home Medications Prior to Admission medications   Medication Sig Start Date End Date Taking? Authorizing Provider  COLLAGEN PO Take by mouth.    [provider]  DM-Phenylephrine-Acetaminophen (ALKA-SELTZER PLS SINUS & COUGH) 10-5-325 MG CAPS Take 1 capsule by mouth daily as needed (cough, congestion).    [provider]  GARLIC PO Take 1 tablet by mouth daily.    [provider]  ibuprofen (ADVIL) 600 MG tablet Take 1 tablet (600 mg total) by mouth every 6 (six) hours as needed. 04/07/19   Lamptey, Britta Mccreedy, MD    Allergies    Latex  Review of Systems   Review of Systems   Constitutional:  Positive for fever.  HENT:  Positive for congestion. Negative for ear pain.   Eyes:  Negative for discharge.  Respiratory:  Positive for cough.   Cardiovascular:  Negative for palpitations and leg swelling.  Gastrointestinal:  Negative for abdominal pain.  Genitourinary:  Negative for difficulty urinating.  Musculoskeletal:  Positive for myalgias.  Skin:  Negative for rash.  Neurological:  Negative for facial asymmetry.  Psychiatric/Behavioral:  Negative for agitation.   All other systems reviewed and are negative.  Physical Exam Updated Vital Signs BP (!) 150/95 (BP Location: Right Arm)   Pulse 86   Temp 98.5 F (36.9 C) (Oral)   Resp 20   SpO2 100%   Physical Exam Vitals and nursing note reviewed.  Constitutional:      General: She is not in acute distress.    Appearance: Normal appearance.  HENT:     Head: Normocephalic and atraumatic.     Nose: Nose normal.  Eyes:     Pupils: Pupils are equal, round, and reactive to light.  Cardiovascular:     Rate and Rhythm: Normal rate and regular rhythm.     Pulses: Normal pulses.     Heart sounds: Normal heart sounds.  Pulmonary:     Effort: Pulmonary effort is normal.     Breath sounds: Normal breath sounds.  Abdominal:  General: Abdomen is flat. Bowel sounds are normal.     Palpations: Abdomen is soft.     Tenderness: There is no abdominal tenderness. There is no guarding.  Musculoskeletal:        General: Normal range of motion.     Cervical back: Normal range of motion and neck supple.  Skin:    General: Skin is warm and dry.     Capillary Refill: Capillary refill takes less than 2 seconds.  Neurological:     General: No focal deficit present.     Mental Status: She is alert and oriented to person, place, and time.     Deep Tendon Reflexes: Reflexes normal.  Psychiatric:        Mood and Affect: Mood normal.        Behavior: Behavior normal.    ED Results / Procedures / Treatments    Labs (all labs ordered are listed, but only abnormal results are displayed) Labs Reviewed  RESP PANEL BY RT-PCR (FLU A&B, COVID) ARPGX2 - Abnormal; Notable for the following components:      Result Value   Influenza A by PCR POSITIVE (*)    All other components within normal limits    EKG None  Radiology No results found.  Procedures Procedures   Medications Ordered in ED Medications  acetaminophen (TYLENOL) tablet 1,000 mg (has no administration in time range)  ibuprofen (ADVIL) tablet 800 mg (has no administration in time range)    ED Course  I have reviewed the triage vital signs and the nursing notes.  Pertinent labs & imaging results that were available during my care of the patient were reviewed by me and considered in my medical decision making (see chart for details). Alternate tylenol and ibuprofen, lots of liquids.  Lungs are clear, exam and vitals are benign and reassuring.  Patient is outside the window for anti virals.    Final Clinical Impression(s) / ED Diagnoses Final diagnoses:  Influenza A   Return for intractable cough, coughing up blood, fevers > 100.4 unrelieved by medication, shortness of breath, intractable vomiting, chest pain, shortness of breath, weakness, numbness, changes in speech, facial asymmetry, abdominal pain, passing out, Inability to tolerate liquids or food, cough, altered mental status or any concerns. No signs of systemic illness or infection. The patient is nontoxic-appearing on exam and vital signs are within normal limits.  I have reviewed the triage vital signs and the nursing notes. Pertinent labs & imaging results that were available during my care of the patient were reviewed by me and considered in my medical decision making (see chart for details). After history, exam, and medical workup I feel the patient has been appropriately medically screened and is safe for discharge home. Pertinent diagnoses were discussed with the patient.  Patient was given return precautions.  Rx / DC Orders ED Discharge Orders     None        Evelyne Makepeace, MD 12/14/20 [id number]

## 2020-12-23 ENCOUNTER — Other Ambulatory Visit: Payer: Self-pay

## 2020-12-23 ENCOUNTER — Emergency Department (HOSPITAL_COMMUNITY): Payer: No Typology Code available for payment source

## 2020-12-23 ENCOUNTER — Emergency Department (HOSPITAL_COMMUNITY)
Admission: EM | Admit: 2020-12-23 | Discharge: 2020-12-24 | Disposition: A | Discharge status: Home / Self Care | Payer: No Typology Code available for payment source | Attending: Emergency Medicine | Admitting: Emergency Medicine

## 2020-12-23 ENCOUNTER — Encounter (HOSPITAL_COMMUNITY): Payer: Self-pay | Admitting: Emergency Medicine

## 2020-12-23 DIAGNOSIS — Y9389 Activity, other specified: Secondary | ICD-10-CM | POA: Diagnosis not present

## 2020-12-23 DIAGNOSIS — S0990XA Unspecified injury of head, initial encounter: Secondary | ICD-10-CM | POA: Insufficient documentation

## 2020-12-23 DIAGNOSIS — M791 Myalgia, unspecified site: Secondary | ICD-10-CM

## 2020-12-23 DIAGNOSIS — S199XXA Unspecified injury of neck, initial encounter: Secondary | ICD-10-CM | POA: Diagnosis not present

## 2020-12-23 DIAGNOSIS — Y92410 Unspecified street and highway as the place of occurrence of the external cause: Secondary | ICD-10-CM | POA: Diagnosis not present
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
## 2020-12-24 ENCOUNTER — Emergency Department (HOSPITAL_COMMUNITY): Payer: No Typology Code available for payment source

## 2020-12-24 DIAGNOSIS — S0990XA Unspecified injury of head, initial encounter: Secondary | ICD-10-CM | POA: Diagnosis not present

## 2020-12-24 MED ORDER — ACETAMINOPHEN 500 MG PO TABS
1000.0000 mg | ORAL_TABLET | Freq: Once | ORAL | Status: AC
  Administered 2020-12-24: 1000 mg via ORAL
  Filled 2020-12-24: qty 2

## 2020-12-24 MED ORDER — KETOROLAC TROMETHAMINE 60 MG/2ML IM SOLN
30.0000 mg | Freq: Once | INTRAMUSCULAR | Status: AC
  Administered 2020-12-24: 30 mg via INTRAMUSCULAR
  Filled 2020-12-24: qty 2

## 2020-12-24 NOTE — Discharge Instructions (Addendum)
You may use over-the-counter Motrin (Ibuprofen), Acetaminophen (Tylenol), topical muscle creams such as SalonPas, Icy Hot, Bengay, etc. Please stretch, apply ice or heat (whichever helps), and have massage therapy for additional assistance.  

## 2020-12-24 NOTE — ED Notes (Signed)
Patient Alert and oriented to baseline. Stable and ambulatory to baseline. Patient verbalized understanding of the discharge instructions.  Patient belongings were taken by the patient.   
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
## 2020-12-24 NOTE — ED Notes (Signed)
Patient transported to X-ray 

## 2021-01-24 ENCOUNTER — Encounter (HOSPITAL_COMMUNITY): Payer: Self-pay | Admitting: Radiology
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
## 2022-10-19 DIAGNOSIS — L501 Idiopathic urticaria: Secondary | ICD-10-CM | POA: Diagnosis not present

## 2023-11-07 ENCOUNTER — Encounter (HOSPITAL_COMMUNITY): Payer: Self-pay

## 2023-11-07 ENCOUNTER — Ambulatory Visit (HOSPITAL_COMMUNITY): Admission: EM | Admit: 2023-11-07 | Discharge: 2023-11-07 | Disposition: A | Discharge status: Home / Self Care

## 2023-11-07 DIAGNOSIS — J029 Acute pharyngitis, unspecified: Secondary | ICD-10-CM

## 2023-11-07 DIAGNOSIS — I10 Essential (primary) hypertension: Secondary | ICD-10-CM | POA: Diagnosis not present

## 2023-11-07 LAB — POCT RAPID STREP A (OFFICE): Rapid Strep A Screen: NEGATIVE

## 2023-11-07 MED ORDER — IBUPROFEN 200 MG PO TABS
800.0000 mg | ORAL_TABLET | Freq: Three times a day (TID) | ORAL | 0 refills | Status: AC | PRN
Start: 2023-11-07 — End: ?

## 2023-11-07 NOTE — ED Triage Notes (Signed)
 Pt states sore throat and bilateral ear pain for the past 4 days.  States she had been taking allergy medication with no relief. And then started taking sinus medication with relief.

## 2023-11-07 NOTE — Discharge Instructions (Addendum)
  1. Viral pharyngitis (Primary) - POC rapid strep A complete and UC is negative for strep pharyngitis - ibuprofen  (ADVIL ) 200 MG tablet; Take 4 tablets (800 mg total) by mouth every 8 (eight) hours as needed for headache or moderate pain (pain score 4-6).  Dispense: 30 tablet; Refill: 0 - Continue using Flonase nasal spray as needed for sinus congestion, nasal congestion, postnasal drip. - Use over-the-counter cough and cold medication for control of other viral symptoms.  2. Essential hypertension, benign - Continue to monitor blood pressure at home to ensure the blood pressure does not become too high or too low. - Recommend follow-up with primary care provider for further evaluation and ongoing management of possible essential hypertension. - In order to receive a confirmed diagnosis of hypertension you need to elevated blood pressure readings at 2 separate visits to confirm long-term hypertension, prior to medication management.  -Continue to monitor symptoms for any change in severity if there is any escalation of current symptoms or development of new symptoms follow-up in ER for further evaluation and management.

## 2023-11-07 NOTE — ED Provider Notes (Signed)
 [facility]  Note:  This document was prepared using Conservation officer, historic buildings and may include unintentional dictation errors.  MRN: [id number] DOB: [birth year]  Subjective:   Ann Dougherty is a 53 y.o. female presenting for sore throat and bilateral ear pain x 4 days.  Patient reports greenish-yellow mucus that has cleared up slightly since starting to take allergy medication.  Patient also reports taking sinus medication with some relief.  Denies any known sick contacts.  No shortness of breath, chest pain, weakness, dizziness, fever  No current facility-administered medications for this encounter.  Current Outpatient Medications:    acetaminophen  (TYLENOL ) 500 MG tablet, Take 500 mg by mouth every 6 (six) hours as needed for moderate pain or headache., Disp: , Rfl:    ibuprofen  (ADVIL ) 200 MG tablet, Take 4 tablets (800 mg total) by mouth every 8 (eight) hours as needed for headache or moderate pain (pain score 4-6)., Disp: 30 tablet, Rfl: 0   Multiple Vitamins-Minerals (HAIR SKIN AND NAILS FORMULA PO), Take 1 tablet by mouth daily., Disp: , Rfl:    Polyethyl Glycol-Propyl Glycol (SYSTANE OP), Place 1 drop into both eyes at bedtime as needed (dry eyes)., Disp: , Rfl:    Allergies  Allergen Reactions   Latex Itching and Swelling    Past Medical History:  Diagnosis Date   Fibromyalgia      History reviewed. No pertinent surgical history.  Family History  Problem Relation Age of Onset   Hypertension Mother    Cancer Mother     Social History   Tobacco Use   Smoking status: Never   Smokeless tobacco: Never  Substance Use Topics   Alcohol use: Yes    Comment: rarely    Drug use: Yes    Types: Marijuana    Comment: Daily use for Fibromyalgia pain    ROS Refer to HPI for ROS details.  Objective:    Vitals: BP (!) 163/90 (BP Location: Left Arm)   Pulse 79   Temp 98.4 F (36.9 C) (Oral)   Resp 16   LMP 04/02/2019   SpO2 97%    Physical Exam Vitals and nursing note reviewed.  Constitutional:      General: She is not in acute distress.    Appearance: She is well-developed. She is not ill-appearing or toxic-appearing.  HENT:     Head: Normocephalic and atraumatic.     Mouth/Throat:     Mouth: Mucous membranes are moist.     Pharynx: Oropharynx is clear. Posterior oropharyngeal erythema present. No oropharyngeal exudate.     Tonsils: No tonsillar exudate. 2+ on the right. 2+ on the left.  Cardiovascular:     Rate and Rhythm: Normal rate.  Pulmonary:     Effort: Pulmonary effort is normal. No respiratory distress.     Breath sounds: No stridor. No wheezing.  Musculoskeletal:        General: Normal range of motion.  Skin:    General: Skin is warm and dry.  Neurological:     General: No focal deficit present.     Mental Status: She is alert and oriented to person, place, and time.  Psychiatric:        Mood and Affect: Mood normal.        Behavior: Behavior normal.     Procedures  Results for orders placed or performed during the hospital encounter of 11/07/23 (from the past 24 hours)  POC rapid strep A     Status: Normal  Collection Time: 11/07/23  9:46 AM  Result Value Ref Range   Rapid Strep A Screen Negative Negative    Assessment and Plan :     Discharge Instructions       1. Viral pharyngitis (Primary) - POC rapid strep A complete and UC is negative for strep pharyngitis - ibuprofen  (ADVIL ) 200 MG tablet; Take 4 tablets (800 mg total) by mouth every 8 (eight) hours as needed for headache or moderate pain (pain score 4-6).  Dispense: 30 tablet; Refill: 0 - Continue using Flonase nasal spray as needed for sinus congestion, nasal congestion, postnasal drip. - Use over-the-counter cough and cold medication for control of other viral symptoms.  2. Essential hypertension, benign - Continue to monitor blood pressure at home to ensure the blood pressure does not become too high or too  low. - Recommend follow-up with primary care provider for further evaluation and ongoing management of possible essential hypertension. - In order to receive a confirmed diagnosis of hypertension you need to elevated blood pressure readings at 2 separate visits to confirm long-term hypertension, prior to medication management.  -Continue to monitor symptoms for any change in severity if there is any escalation of current symptoms or development of new symptoms follow-up in ER for further evaluation and management.      Alyana Kreiter B Ellisha Bankson   Luigi Stuckey, [place] B, NP 11/07/23 1010
